# Patient Record
Sex: Male | Born: 1990 | Race: White | Hispanic: No | Marital: Single | Smoking: Current every day smoker
Health system: Southern US, Community
[De-identification: ages and names within clinical notes are randomized; demographics above are authoritative.]

## PROBLEM LIST (undated history)

## (undated) DIAGNOSIS — S62339A Displaced fracture of neck of unspecified metacarpal bone, initial encounter for closed fracture: Secondary | ICD-10-CM

## (undated) DIAGNOSIS — T4145XA Adverse effect of unspecified anesthetic, initial encounter: Secondary | ICD-10-CM

## (undated) DIAGNOSIS — E109 Type 1 diabetes mellitus without complications: Secondary | ICD-10-CM

## (undated) DIAGNOSIS — Z8781 Personal history of (healed) traumatic fracture: Secondary | ICD-10-CM

## (undated) DIAGNOSIS — T8859XA Other complications of anesthesia, initial encounter: Secondary | ICD-10-CM

---
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
## 2012-12-21 ENCOUNTER — Encounter (HOSPITAL_BASED_OUTPATIENT_CLINIC_OR_DEPARTMENT_OTHER): Payer: Self-pay | Admitting: Emergency Medicine

## 2012-12-21 ENCOUNTER — Emergency Department (HOSPITAL_BASED_OUTPATIENT_CLINIC_OR_DEPARTMENT_OTHER)
Admission: EM | Admit: 2012-12-21 | Discharge: 2012-12-21 | Disposition: A | Discharge status: Home / Self Care | Payer: No Typology Code available for payment source | Attending: Emergency Medicine | Admitting: Emergency Medicine

## 2012-12-21 ENCOUNTER — Emergency Department (HOSPITAL_BASED_OUTPATIENT_CLINIC_OR_DEPARTMENT_OTHER): Payer: No Typology Code available for payment source

## 2012-12-21 DIAGNOSIS — F17200 Nicotine dependence, unspecified, uncomplicated: Secondary | ICD-10-CM | POA: Insufficient documentation

## 2012-12-21 DIAGNOSIS — S62300A Unspecified fracture of second metacarpal bone, right hand, initial encounter for closed fracture: Secondary | ICD-10-CM

## 2012-12-21 DIAGNOSIS — S62309A Unspecified fracture of unspecified metacarpal bone, initial encounter for closed fracture: Secondary | ICD-10-CM | POA: Insufficient documentation

## 2012-12-21 DIAGNOSIS — Z8781 Personal history of (healed) traumatic fracture: Secondary | ICD-10-CM

## 2012-12-21 DIAGNOSIS — S0993XA Unspecified injury of face, initial encounter: Secondary | ICD-10-CM | POA: Insufficient documentation

## 2012-12-21 DIAGNOSIS — S02400A Malar fracture unspecified, initial encounter for closed fracture: Secondary | ICD-10-CM | POA: Insufficient documentation

## 2012-12-21 DIAGNOSIS — S0990XA Unspecified injury of head, initial encounter: Secondary | ICD-10-CM | POA: Insufficient documentation

## 2012-12-21 DIAGNOSIS — S02401A Maxillary fracture, unspecified, initial encounter for closed fracture: Secondary | ICD-10-CM | POA: Insufficient documentation

## 2012-12-21 HISTORY — DX: Personal history of (healed) traumatic fracture: Z87.81

## 2012-12-21 MED ORDER — HYDROCODONE-ACETAMINOPHEN 5-325 MG PO TABS: 1.0000 | ORAL_TABLET | ORAL | Status: DC | PRN
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
## 2012-12-21 NOTE — ED Notes (Signed)
ONLY CHARGE FOR ONE [employer] ORTHO SPLINT

## 2012-12-21 NOTE — ED Notes (Signed)
ONLY CHARGE FOR ONE
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
## 2012-12-31 DIAGNOSIS — S62339A Displaced fracture of neck of unspecified metacarpal bone, initial encounter for closed fracture: Secondary | ICD-10-CM

## 2012-12-31 HISTORY — DX: Displaced fracture of neck of unspecified metacarpal bone, initial encounter for closed fracture: S62.339A

## 2013-01-21 ENCOUNTER — Encounter (HOSPITAL_BASED_OUTPATIENT_CLINIC_OR_DEPARTMENT_OTHER): Payer: Self-pay | Admitting: *Deleted

## 2013-01-22 NOTE — H&P (Signed)
Henry Walter is an 22 y.o. male.   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 Fracture, metacarpal, neck 12/31/2012    right index  . H/O fracture of nose 12/21/2012    Past Surgical History  Procedure Laterality Date  . No past surgeries      History reviewed. No pertinent family history. Social History:  reports that he has been smoking Cigarettes.  He has a 3 pack-year smoking history. His smokeless tobacco use includes Chew. He reports that he drinks alcohol. He reports that he does not use illicit drugs.  Allergies:  Allergies  Allergen Reactions  . Hydrocodone Nausea And Vomiting  . Percocet [Oxycodone-Acetaminophen] Itching    No prescriptions prior to admission   ROS: NO RECENT ILLNESSES OR HOSPITALIZATIONS No results found for this or any previous visit (from the past 48 hour(s)). No results found.  General Appearance:  Alert, cooperative, no distress, appears stated age  Head:  Normocephalic, without obvious abnormality, atraumatic  Eyes:  Pupils equal, conjunctiva/corneas clear,         Throat: Lips, mucosa, and tongue normal; teeth and gums normal  Neck: No visible masses     Lungs:   respirations unlabored  Chest Wall:  No tenderness or deformity  Heart:  Regular rate and rhythm,  Abdomen:   Soft, non-tender,         Extremities: RIGHT HAND: +DEFORMITY TO DORSUM OF RIGHT HAND FINGER WARM WELL PERFUSED LOSS OF KNUCKLE CONTOUR DOES NOT HAVE FULL COMPOSITE FIST GOOD MOBILITY TO RING AND SMALL FINGER  Pulses: 2+ and symmetric  Skin: Skin color, texture, turgor normal, no rashes or lesions     Neurologic: Normal    Assessment/Plan 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  PT VOICED UNDERSTANDING OF PLAN CONSENT SIGNED DAY OF SURGERY PT SEEN AND EXAMINED PRIOR TO OPERATIVE PROCEDURE/DAY OF SURGERY SITE MARKED. QUESTIONS ANSWERED WILL GO HOME FOLLOWING SURGERY  Sharma Covert 01/23/2013 at 1433

## 2013-01-22 NOTE — Brief Op Note (Signed)
01/23/2013  3:27 PM  PATIENT:  Henry Walter  22 y.o. male  PRE-OPERATIVE DIAGNOSIS:  right index finger metacarpal neck fracture  POST-OPERATIVE DIAGNOSIS: SAME  PROCEDURE:  Procedure(s): RIGHT INDEX FINGER OPEN REDUCTION INTERNAL FIXATION (ORIF) METACARPAL POSSIBLE PINNING (Right)  SURGEON:  Surgeon(s) and Role:    * Sharma Covert, MD - Primary  PHYSICIAN ASSISTANT:   ASSISTANTS: none   ANESTHESIA:   general  EBL:   minimal  BLOOD ADMINISTERED:none  DRAINS: none   LOCAL MEDICATIONS USED:  MARCAINE     SPECIMEN:  No Specimen  DISPOSITION OF SPECIMEN:  N/A  COUNTS:  YES  TOURNIQUET:  * No tourniquets in log *  DICTATION: .Other Dictation: Dictation Number [id number]  PLAN OF CARE: Discharge to home after PACU  PATIENT DISPOSITION:  PACU - hemodynamically stable.   Delay start of Pharmacological VTE agent (>24hrs) due to surgical blood loss or risk of bleeding: not applicable

## 2013-01-23 ENCOUNTER — Encounter (HOSPITAL_BASED_OUTPATIENT_CLINIC_OR_DEPARTMENT_OTHER): Payer: Self-pay | Admitting: *Deleted

## 2013-01-23 ENCOUNTER — Encounter (HOSPITAL_BASED_OUTPATIENT_CLINIC_OR_DEPARTMENT_OTHER): Payer: No Typology Code available for payment source | Admitting: Anesthesiology

## 2013-01-23 ENCOUNTER — Encounter (HOSPITAL_BASED_OUTPATIENT_CLINIC_OR_DEPARTMENT_OTHER)
Admission: RE | Disposition: A | Discharge status: Home / Self Care | Payer: Self-pay | Source: Ambulatory Visit | Attending: Orthopedic Surgery

## 2013-01-23 ENCOUNTER — Ambulatory Visit (HOSPITAL_BASED_OUTPATIENT_CLINIC_OR_DEPARTMENT_OTHER): Payer: No Typology Code available for payment source | Admitting: Anesthesiology

## 2013-01-23 ENCOUNTER — Ambulatory Visit (HOSPITAL_BASED_OUTPATIENT_CLINIC_OR_DEPARTMENT_OTHER)
Admission: RE | Admit: 2013-01-23 | Discharge: 2013-01-23 | Disposition: A | Discharge status: Home / Self Care | Payer: No Typology Code available for payment source | Source: Ambulatory Visit | Attending: Orthopedic Surgery | Admitting: Orthopedic Surgery

## 2013-01-23 DIAGNOSIS — IMO0002 Reserved for concepts with insufficient information to code with codable children: Secondary | ICD-10-CM | POA: Insufficient documentation

## 2013-01-23 DIAGNOSIS — S62309A Unspecified fracture of unspecified metacarpal bone, initial encounter for closed fracture: Secondary | ICD-10-CM

## 2013-01-23 DIAGNOSIS — F17200 Nicotine dependence, unspecified, uncomplicated: Secondary | ICD-10-CM | POA: Insufficient documentation

## 2013-01-23 HISTORY — DX: Displaced fracture of neck of unspecified metacarpal bone, initial encounter for closed fracture: S62.339A

## 2013-01-23 HISTORY — PX: OPEN REDUCTION INTERNAL FIXATION (ORIF) METACARPAL: SHX6234

## 2013-01-23 HISTORY — DX: Personal history of (healed) traumatic fracture: Z87.81

## 2013-01-23 SURGERY — OPEN REDUCTION INTERNAL FIXATION (ORIF) METACARPAL
Anesthesia: General | Site: Finger | Laterality: Right | Wound class: Clean

## 2013-01-23 MED ORDER — MIDAZOLAM HCL 2 MG/ML PO SYRP: 12.0000 mg | ORAL_SOLUTION | Freq: Once | ORAL | Status: DC | PRN

## 2013-01-23 MED ORDER — LACTATED RINGERS IV SOLN
INTRAVENOUS | Status: DC
  Administered 2013-01-23 (×2): via INTRAVENOUS

## 2013-01-23 MED ORDER — MIDAZOLAM HCL 5 MG/5ML IJ SOLN
INTRAMUSCULAR | Status: DC | PRN
  Administered 2013-01-23: 2 mg via INTRAVENOUS

## 2013-01-23 MED ORDER — BUPIVACAINE HCL (PF) 0.25 % IJ SOLN
INTRAMUSCULAR | Status: AC
  Filled 2013-01-23: qty 30

## 2013-01-23 MED ORDER — BUPIVACAINE-EPINEPHRINE PF 0.5-1:200000 % IJ SOLN
INTRAMUSCULAR | Status: DC | PRN
  Administered 2013-01-23: 30 mL via PERINEURAL

## 2013-01-23 MED ORDER — MIDAZOLAM HCL 2 MG/2ML IJ SOLN
INTRAMUSCULAR | Status: AC
  Filled 2013-01-23: qty 2

## 2013-01-23 MED ORDER — DEXAMETHASONE SODIUM PHOSPHATE 10 MG/ML IJ SOLN
INTRAMUSCULAR | Status: DC | PRN
  Administered 2013-01-23: 10 mg via INTRAVENOUS

## 2013-01-23 MED ORDER — PROPOFOL 10 MG/ML IV BOLUS
INTRAVENOUS | Status: DC | PRN
  Administered 2013-01-23: 50 mg via INTRAVENOUS
  Administered 2013-01-23: 200 mg via INTRAVENOUS

## 2013-01-23 MED ORDER — OXYCODONE-ACETAMINOPHEN 10-325 MG PO TABS: 1.0000 | ORAL_TABLET | ORAL | Status: DC | PRN

## 2013-01-23 MED ORDER — CHLORHEXIDINE GLUCONATE 4 % EX LIQD: 60.0000 mL | Freq: Once | CUTANEOUS | Status: DC

## 2013-01-23 MED ORDER — CEFAZOLIN SODIUM-DEXTROSE 2-3 GM-% IV SOLR
INTRAVENOUS | Status: AC
  Filled 2013-01-23: qty 50

## 2013-01-23 MED ORDER — FENTANYL CITRATE 0.05 MG/ML IJ SOLN
INTRAMUSCULAR | Status: AC
  Filled 2013-01-23: qty 4

## 2013-01-23 MED ORDER — MIDAZOLAM HCL 2 MG/2ML IJ SOLN
1.0000 mg | INTRAMUSCULAR | Status: DC | PRN
  Administered 2013-01-23: 2 mg via INTRAVENOUS

## 2013-01-23 MED ORDER — ONDANSETRON HCL 4 MG PO TABS: 4.0000 mg | ORAL_TABLET | Freq: Three times a day (TID) | ORAL | Status: DC | PRN

## 2013-01-23 MED ORDER — BUPIVACAINE HCL (PF) 0.5 % IJ SOLN
INTRAMUSCULAR | Status: AC
  Filled 2013-01-23: qty 30

## 2013-01-23 MED ORDER — FENTANYL CITRATE 0.05 MG/ML IJ SOLN
INTRAMUSCULAR | Status: DC | PRN
  Administered 2013-01-23: 50 ug via INTRAVENOUS

## 2013-01-23 MED ORDER — FENTANYL CITRATE 0.05 MG/ML IJ SOLN
INTRAMUSCULAR | Status: AC
  Filled 2013-01-23: qty 2

## 2013-01-23 MED ORDER — CEFAZOLIN SODIUM-DEXTROSE 2-3 GM-% IV SOLR
2.0000 g | INTRAVENOUS | Status: AC
  Administered 2013-01-23: 2 g via INTRAVENOUS

## 2013-01-23 MED ORDER — FENTANYL CITRATE 0.05 MG/ML IJ SOLN
50.0000 ug | INTRAMUSCULAR | Status: DC | PRN
  Administered 2013-01-23: 100 ug via INTRAVENOUS

## 2013-01-23 MED ORDER — DOCUSATE SODIUM 100 MG PO CAPS: 100.0000 mg | ORAL_CAPSULE | Freq: Two times a day (BID) | ORAL | Status: DC

## 2013-01-23 MED ORDER — ONDANSETRON HCL 4 MG/2ML IJ SOLN
INTRAMUSCULAR | Status: DC | PRN
  Administered 2013-01-23: 4 mg via INTRAVENOUS

## 2013-01-23 MED ORDER — PROPOFOL 10 MG/ML IV BOLUS
INTRAVENOUS | Status: AC
  Filled 2013-01-23: qty 20

## 2013-01-23 SURGICAL SUPPLY — 71 items
BANDAGE ELASTIC 3 VELCRO ST LF (GAUZE/BANDAGES/DRESSINGS) IMPLANT
BANDAGE ELASTIC 4 VELCRO ST LF (GAUZE/BANDAGES/DRESSINGS) ×2 IMPLANT
BANDAGE GAUZE ELAST BULKY 4 IN (GAUZE/BANDAGES/DRESSINGS) ×2 IMPLANT
BENZOIN TINCTURE PRP APPL 2/3 (GAUZE/BANDAGES/DRESSINGS) IMPLANT
BIT DRILL 1.1 MINI QC NONSTRL (BIT) ×2 IMPLANT
BLADE SURG 15 STRL LF DISP TIS (BLADE) ×2 IMPLANT
BLADE SURG 15 STRL SS (BLADE) ×2
BNDG ELASTIC 2 VLCR STRL LF (GAUZE/BANDAGES/DRESSINGS) IMPLANT
BNDG ESMARK 4X9 LF (GAUZE/BANDAGES/DRESSINGS) ×2 IMPLANT
CANISTER SUCT 1200ML W/VALVE (MISCELLANEOUS) IMPLANT
CANISTER SUCTION 1200CC (MISCELLANEOUS) ×2 IMPLANT
CORDS BIPOLAR (ELECTRODE) ×4 IMPLANT
COVER TABLE BACK 60X90 (DRAPES) ×2 IMPLANT
CUFF TOURNIQUET SINGLE 18IN (TOURNIQUET CUFF) ×2 IMPLANT
DECANTER SPIKE VIAL GLASS SM (MISCELLANEOUS) IMPLANT
DRAPE EXTREMITY T 121X128X90 (DRAPE) ×2 IMPLANT
DRAPE OEC MINIVIEW 54X84 (DRAPES) ×2 IMPLANT
DRAPE SURG 17X23 STRL (DRAPES) ×2 IMPLANT
DRSG EMULSION OIL 3X3 NADH (GAUZE/BANDAGES/DRESSINGS) ×2 IMPLANT
GAUZE SPONGE 4X4 16PLY XRAY LF (GAUZE/BANDAGES/DRESSINGS) IMPLANT
GAUZE XEROFORM 1X8 LF (GAUZE/BANDAGES/DRESSINGS) IMPLANT
GLOVE BIO SURGEON STRL SZ8 (GLOVE) ×4 IMPLANT
GLOVE BIOGEL M 7.0 STRL (GLOVE) ×2 IMPLANT
GLOVE BIOGEL PI IND STRL 7.0 (GLOVE) ×1 IMPLANT
GLOVE BIOGEL PI IND STRL 8.5 (GLOVE) ×1 IMPLANT
GLOVE BIOGEL PI INDICATOR 7.0 (GLOVE) ×1
GLOVE BIOGEL PI INDICATOR 8.5 (GLOVE) ×1
GLOVE ECLIPSE 6.5 STRL STRAW (GLOVE) ×2 IMPLANT
GLOVE EXAM NITRILE EXT CUFF MD (GLOVE) ×2 IMPLANT
GLOVE SURG SS PI 7.5 STRL IVOR (GLOVE) ×2 IMPLANT
GOWN PREVENTION PLUS XLARGE (GOWN DISPOSABLE) ×4 IMPLANT
GOWN STRL REIN 2XL XLG LVL4 (GOWN DISPOSABLE) ×2 IMPLANT
K-WIRE .045X6 DBL TRO NS (WIRE) ×4
K-WIRE DBL TRONS .035X6 (WIRE) ×4
KWIRE .045X6 DBL TRO NS (WIRE) ×2 IMPLANT
KWIRE DBL TRONS .035X6 (WIRE) ×2 IMPLANT
LOCK SCREW 1.5X15MM (Screw) ×8 IMPLANT
NEEDLE HYPO 25X1 1.5 SAFETY (NEEDLE) IMPLANT
NS IRRIG 1000ML POUR BTL (IV SOLUTION) ×2 IMPLANT
PACK BASIN DAY SURGERY FS (CUSTOM PROCEDURE TRAY) ×2 IMPLANT
PAD CAST 3X4 CTTN HI CHSV (CAST SUPPLIES) ×1 IMPLANT
PAD CAST 4YDX4 CTTN HI CHSV (CAST SUPPLIES) ×1 IMPLANT
PADDING CAST ABS 3INX4YD NS (CAST SUPPLIES) ×1
PADDING CAST ABS COTTON 3X4 (CAST SUPPLIES) ×1 IMPLANT
PADDING CAST COTTON 3X4 STRL (CAST SUPPLIES) ×1
PADDING CAST COTTON 4X4 STRL (CAST SUPPLIES) ×1
PADDING UNDERCAST 2  STERILE (CAST SUPPLIES) IMPLANT
PLATE T SMALL 1.5MM (Plate) ×2 IMPLANT
SCREW 1.5X15MM (Screw) ×2 IMPLANT
SCREW LOCK 1.5X15MM (Screw) ×4 IMPLANT
SCREW LOCKING 1.5X16 (Screw) ×2 IMPLANT
SCREW NONIOC 1.5 14M (Screw) ×2 IMPLANT
SHEET MEDIUM DRAPE 40X70 STRL (DRAPES) IMPLANT
SPLINT FIBERGLASS 3X35 (CAST SUPPLIES) IMPLANT
SPLINT FIBERGLASS 4X30 (CAST SUPPLIES) ×2 IMPLANT
STOCKINETTE 4X48 STRL (DRAPES) ×2 IMPLANT
STRIP CLOSURE SKIN 1/2X4 (GAUZE/BANDAGES/DRESSINGS) IMPLANT
SUCTION FRAZIER TIP 10 FR DISP (SUCTIONS) ×2 IMPLANT
SUT MNCRL AB 3-0 PS2 18 (SUTURE) ×2 IMPLANT
SUT MON AB 4-0 PC3 18 (SUTURE) IMPLANT
SUT PROLENE 4 0 PS 2 18 (SUTURE) ×2 IMPLANT
SUT VIC AB 2-0 SH 27 (SUTURE) ×1
SUT VIC AB 2-0 SH 27XBRD (SUTURE) ×1 IMPLANT
SUT VIC AB 3-0 FS2 27 (SUTURE) ×2 IMPLANT
SUT VICRYL 4-0 PS2 18IN ABS (SUTURE) ×2 IMPLANT
SYR BULB 3OZ (MISCELLANEOUS) ×2 IMPLANT
SYR CONTROL 10ML LL (SYRINGE) IMPLANT
TOWEL OR 17X24 6PK STRL BLUE (TOWEL DISPOSABLE) ×4 IMPLANT
TRAY DSU PREP LF (CUSTOM PROCEDURE TRAY) ×2 IMPLANT
TUBE CONNECTING 20X1/4 (TUBING) ×2 IMPLANT
UNDERPAD 30X30 INCONTINENT (UNDERPADS AND DIAPERS) ×2 IMPLANT

## 2013-01-23 NOTE — Anesthesia Postprocedure Evaluation (Signed)
Anesthesia Post Note  Patient: Henry Walter  Procedure(s) Performed: Procedure(s) (LRB): RIGHT INDEX FINGER OPEN REDUCTION INTERNAL FIXATION (ORIF) METACARPAL  (Right)  Anesthesia type: general  Patient location: PACU  Post pain: Pain level controlled  Post assessment: Patient's Cardiovascular Status Stable  Last Vitals:  Filed Vitals:   01/23/13 1800  BP: 139/72  Pulse: 73  Temp: 37.1 C  Resp: 14    Post vital signs: Reviewed and stable  Level of consciousness: sedated  Complications: No apparent anesthesia complications

## 2013-01-23 NOTE — Transfer of Care (Signed)
Immediate Anesthesia Transfer of Care Note  Patient: Henry Walter  Procedure(s) Performed: Procedure(s): RIGHT INDEX FINGER OPEN REDUCTION INTERNAL FIXATION (ORIF) METACARPAL  (Right)  Patient Location: PACU  Anesthesia Type:General and Regional  Level of Consciousness: awake  Airway & Oxygen Therapy: Patient Spontanous Breathing and Patient connected to face mask oxygen  Post-op Assessment: Report given to PACU RN and Post -op Vital signs reviewed and stable  Post vital signs: Reviewed and stable  Complications: No apparent anesthesia complications

## 2013-01-23 NOTE — Anesthesia Preprocedure Evaluation (Signed)
Anesthesia Evaluation  Patient identified by MRN, date of birth, ID band Patient awake    Reviewed: Allergy & Precautions, H&P , NPO status , Patient's Chart, lab work & pertinent test results  Airway Mallampati: I TM Distance: >3 FB Neck ROM: Full    Dental   Pulmonary Current Smoker,          Cardiovascular     Neuro/Psych    GI/Hepatic   Endo/Other    Renal/GU      Musculoskeletal   Abdominal   Peds  Hematology   Anesthesia Other Findings   Reproductive/Obstetrics                           Anesthesia Physical Anesthesia Plan  ASA: I  Anesthesia Plan: General   Post-op Pain Management:    Induction: Intravenous  Airway Management Planned: LMA  Additional Equipment:   Intra-op Plan:   Post-operative Plan: Extubation in OR  Informed Consent: I have reviewed the patients History and Physical, chart, labs and discussed the procedure including the risks, benefits and alternatives for the proposed anesthesia with the patient or authorized representative who has indicated his/her understanding and acceptance.     Plan Discussed with: CRNA and Surgeon  Anesthesia Plan Comments:         Anesthesia Quick Evaluation  

## 2013-01-23 NOTE — Progress Notes (Signed)
Assisted Dr. Ossey with right, ultrasound guided, supraclavicular block. Side rails up, monitors on throughout procedure. See vital signs in flow sheet. Tolerated Procedure well. 

## 2013-01-23 NOTE — Brief Op Note (Signed)
01/23/2013  6:10 PM  PATIENT:  Henry Walter  22 y.o. male  PRE-OPERATIVE DIAGNOSIS:  right index finger metacarpal neck fracture  POST-OPERATIVE DIAGNOSIS: SAME  PROCEDURE:  Procedure(s): RIGHT INDEX FINGER OPEN REDUCTION INTERNAL FIXATION (ORIF) METACARPAL  (Right)  SURGEON:  Surgeon(s) and Role:    * Sharma Covert, MD - Primary  PHYSICIAN ASSISTANT:   ASSISTANTS: none   ANESTHESIA:   general  EBL:  Total I/O In: 1600 [I.V.:1600] Out: - minimal  BLOOD ADMINISTERED:none  DRAINS: none   LOCAL MEDICATIONS USED:  MARCAINE     SPECIMEN:  No Specimen  DISPOSITION OF SPECIMEN:  N/A  COUNTS:  YES  TOURNIQUET:    DICTATION: .Other Dictation: Dictation Number [id number]  PLAN OF CARE: Discharge to home after PACU  PATIENT DISPOSITION:  PACU - hemodynamically stable.   Delay start of Pharmacological VTE agent (>24hrs) due to surgical blood loss or risk of bleeding: not applicable

## 2013-01-23 NOTE — Anesthesia Procedure Notes (Addendum)
Anesthesia Regional Block:  Supraclavicular block  Pre-Anesthetic Checklist: ,, timeout performed, Correct Patient, Correct Site, Correct Laterality, Correct Procedure, Correct Position, site marked, Risks and benefits discussed,  Surgical consent,  Pre-op evaluation,  At surgeon's request and post-op pain management  Laterality: Right  Prep: chloraprep       Needles:   Needle Type: Echogenic Stimulator Needle     Needle Length: 5cm 5 cm Needle Gauge: 21 and 21 G    Additional Needles:  Procedures: ultrasound guided (picture in chart) and nerve stimulator Supraclavicular block  Nerve Stimulator or Paresthesia:  Response: 0.4 mA,   Additional Responses:   Narrative:  Start time: 01/23/2013 1:46 PM End time: 01/23/2013 1:56 PM Injection made incrementally with aspirations every 5 mL.  Performed by: Personally  Anesthesiologist: Arta Bruce MD  Additional Notes: Monitors applied. Patient sedated. Sterile prep and drape,hand hygiene and sterile gloves were used. Relevant anatomy identified.Needle position confirmed.Local anesthetic injected incrementally after negative aspiration. Local anesthetic spread visualized around nerve(s). Vascular puncture avoided. No complications. Image printed for medical record.The patient tolerated the procedure well.       Supraclavicular block Procedure Name: LMA Insertion Date/Time: 01/23/2013 2:56 PM Performed by: Burna Cash Pre-anesthesia Checklist: Patient identified, Emergency Drugs available, Suction available and Patient being monitored Patient Re-evaluated:Patient Re-evaluated prior to inductionOxygen Delivery Method: Circle System Utilized Preoxygenation: Pre-oxygenation with 100% oxygen Intubation Type: IV induction Ventilation: Mask ventilation without difficulty LMA: LMA inserted LMA Size: 5.0 Number of attempts: 1 Airway Equipment and Method: bite block Placement Confirmation: positive ETCO2 Tube secured with:  Tape Dental Injury: Teeth and Oropharynx as per pre-operative assessment

## 2013-01-24 ENCOUNTER — Encounter (HOSPITAL_BASED_OUTPATIENT_CLINIC_OR_DEPARTMENT_OTHER): Payer: Self-pay | Admitting: Orthopedic Surgery

## 2013-01-31 NOTE — Op Note (Signed)
NAME:  Henry Walter, Henry Walter               ACCOUNT NO.:  [id number]  MEDICAL RECORD NO.:  [id number]  LOCATION:                               FACILITY:  [facility]  PHYSICIAN:  Mousa Prout W Isiac Breighner IV, MD  DATE OF BIRTH:  09/23/1990  DATE OF PROCEDURE:  01/23/2013 DATE OF DISCHARGE:  01/23/2013                              OPERATIVE REPORT   PREOPERATIVE DIAGNOSIS:  Right index finger metacarpal neck malunion.  POSTOPERATIVE DIAGNOSIS:  Right index finger metacarpal neck malunion.  ATTENDING PHYSICIAN:  Colbin Jovel W Biviana Saddler IV, MD, who was scrubbed and present for the entire procedure.  ASSISTANT SURGEON:  None.  ANESTHESIA:  General via LMA.  SURGICAL PROCEDURE: 1. Open treatment of right index finger metacarpal nascent malunion     with internal fixation. 2. Radiographs 3 views, right hand.  SURGICAL INDICATIONS:  Mr. [name] is a right-hand-dominant gentleman, who sustained a closed metacarpal neck fracture greater than 1 month ago.  The patient was followed in the office and was noted to have the nascent  malunion.  The patient is recommended to undergo the above procedure.  Risks, benefits, and alternatives were discussed in detail with the patient.  Signed informed consent was obtained.  Risks include, but not limited to bleeding, infection, damage to nearby nerves, arteries, or tendons, loss of motion of the wrist and digits, incomplete relief of symptoms, and need for further surgical intervention.  DESCRIPTION OF PROCEDURE:  The patient was properly identified in the preoperative holding area and marked with a permanent marker made on the right index finger to indicate correct operative site.  The patient was then brought back to operative room, placed supine on the anesthesia room table.  General anesthesia was administered.  The patient tolerated this well.  A well-padded tourniquet was then placed on the right brachium and sealed with 1000 drape.  Right upper extremity was then prepped  and draped in normal sterile fashion.  Time-out was called. Correct site was identified, and procedure was then begun.  Attention was then turned to the right hand where a longitudinal incision made directly over the index finger metacarpal.  Dissection was then carried down through the skin and subcutaneous tissue.  The extensor tendons were then carefully retracted and the fascial layer over the bone was then carefully exposed.  The patient did have a malunion of the index finger metacarpal neck region and time was spent taking down the malunion. The patient did have abundant fracture callus and careful dissection was made to takedown the fracture callus.  This was released both dorsally and volarly and along the radial ulnar margins in order to free the distal fragment.  Once the distal fragment was then freed up, internal fixation was then carried out with a small T plate.  The 1.5 mm T-plate from the DePuy hand ALPS.  A combination of the locking and nonlocking screws were then used proximally and distally in order to maintain the reduction.  Appropriate drilling and depth gauge measurement was then carried out.  The wound was then thoroughly irrigated.  After internal fixation of the nascent malunion, the wound was then copiously irrigated.  The fascial layer was then closed   over with Vicryl suture.  The subcutaneous tissue was closed with 4-0 Vicryl and the skin closed with skin sutures and 4-0 Prolene sutures.  A 10 mL of 0.25% Marcaine infiltrated locally.  Adaptic dressing and sterile compressive bandage were then applied.  The patient was then placed in a well-padded radial gutter splint, extubated, and taken to recovery room in good condition.  Radiographs 3 views of the hand did show the internal fixation in place. There was good position in both planes.  POSTPROCEDURE PLAN:  The patient discharged home and seen back in the office in approximately 2 weeks for wound check,  suture removal, x-rays, out of the splint and then we will likely immobilize him for a total of 4 weeks, and then begin a therapy regimen until 4-week mark. Radiographs at each visit.     Domique Clapper W Felecia Stanfill IV, MD   ______________________________ Tramell Piechota W Amador Braddy IV, MD    FWO/MEDQ  D:  01/30/2013  T:  01/30/2013  Job:  [id number] 

## 2013-01-31 NOTE — Op Note (Deleted)
Henry Walter, CIMO NO.:  [id number]  MEDICAL RECORD NO.:  [id number]  LOCATION:                               FACILITY:  [facility]  PHYSICIAN:  Madelynn Done, MD  DATE OF BIRTH:  February 26, 1991  DATE OF PROCEDURE:  01/23/2013 DATE OF DISCHARGE:  01/23/2013                              OPERATIVE REPORT   PREOPERATIVE DIAGNOSIS:  Right index finger metacarpal neck malunion.  POSTOPERATIVE DIAGNOSIS:  Right index finger metacarpal neck malunion.  ATTENDING PHYSICIAN:  Madelynn Done, MD, who was scrubbed and present for the entire procedure.  ASSISTANT SURGEON:  None.  ANESTHESIA:  General via LMA.  SURGICAL PROCEDURE: 1. Open treatment of right index finger metacarpal nascent malunion     with internal fixation. 2. Radiographs 3 views, right hand.  SURGICAL INDICATIONS:  Mr. Passey is a right-hand-dominant gentleman, who sustained a closed metacarpal neck fracture greater than 1 month ago.  The patient was followed in the office and was noted to have the nascent  malunion.  The patient is recommended to undergo the above procedure.  Risks, benefits, and alternatives were discussed in detail with the patient.  Signed informed consent was obtained.  Risks include, but not limited to bleeding, infection, damage to nearby nerves, arteries, or tendons, loss of motion of the wrist and digits, incomplete relief of symptoms, and need for further surgical intervention.  DESCRIPTION OF PROCEDURE:  The patient was properly identified in the preoperative holding area and marked with a permanent marker made on the right index finger to indicate correct operative site.  The patient was then brought back to operative room, placed supine on the anesthesia room table.  General anesthesia was administered.  The patient tolerated this well.  A well-padded tourniquet was then placed on the right brachium and sealed with 1000 drape.  Right upper extremity was then prepped  and draped in normal sterile fashion.  Time-out was called. Correct site was identified, and procedure was then begun.  Attention was then turned to the right hand where a longitudinal incision made directly over the index finger metacarpal.  Dissection was then carried down through the skin and subcutaneous tissue.  The extensor tendons were then carefully retracted and the fascial layer over the bone was then carefully exposed.  The patient did have a malunion of the index finger metacarpal neck region and time was spent taking down the malunion. The patient did have abundant fracture callus and careful dissection was made to takedown the fracture callus.  This was released both dorsally and volarly and along the radial ulnar margins in order to free the distal fragment.  Once the distal fragment was then freed up, internal fixation was then carried out with a small T plate.  The 1.5 mm T-plate from the DePuy hand ALPS.  A combination of the locking and nonlocking screws were then used proximally and distally in order to maintain the reduction.  Appropriate drilling and depth gauge measurement was then carried out.  The wound was then thoroughly irrigated.  After internal fixation of the nascent malunion, the wound was then copiously irrigated.  The fascial layer was then closed  over with Vicryl suture.  The subcutaneous tissue was closed with 4-0 Vicryl and the skin closed with skin sutures and 4-0 Prolene sutures.  A 10 mL of 0.25% Marcaine infiltrated locally.  Adaptic dressing and sterile compressive bandage were then applied.  The patient was then placed in a well-padded radial gutter splint, extubated, and taken to recovery room in good condition.  Radiographs 3 views of the hand did show the internal fixation in place. There was good position in both planes.  POSTPROCEDURE PLAN:  The patient discharged home and seen back in the office in approximately 2 weeks for wound check,  suture removal, x-rays, out of the splint and then we will likely immobilize him for a total of 4 weeks, and then begin a therapy regimen until 4-week mark. Radiographs at each visit.     Madelynn Done, MD   ______________________________ Madelynn Done, MD    FWO/MEDQ  D:  01/30/2013  T:  01/30/2013  Job:  [id number]

## 2014-11-23 ENCOUNTER — Inpatient Hospital Stay (HOSPITAL_COMMUNITY): Payer: BLUE CROSS/BLUE SHIELD | Admitting: Certified Registered"

## 2014-11-23 ENCOUNTER — Observation Stay (HOSPITAL_COMMUNITY)
Admission: AD | Admit: 2014-11-23 | Discharge: 2014-11-24 | Disposition: A | Discharge status: Home / Self Care | Payer: BLUE CROSS/BLUE SHIELD | Source: Ambulatory Visit | Attending: Orthopedic Surgery | Admitting: Orthopedic Surgery

## 2014-11-23 ENCOUNTER — Encounter (HOSPITAL_COMMUNITY)
Admission: AD | Disposition: A | Discharge status: Home / Self Care | Payer: Self-pay | Source: Ambulatory Visit | Attending: Orthopedic Surgery

## 2014-11-23 ENCOUNTER — Encounter (HOSPITAL_COMMUNITY): Payer: Self-pay | Admitting: *Deleted

## 2014-11-23 DIAGNOSIS — F17210 Nicotine dependence, cigarettes, uncomplicated: Secondary | ICD-10-CM | POA: Insufficient documentation

## 2014-11-23 DIAGNOSIS — S52502A Unspecified fracture of the lower end of left radius, initial encounter for closed fracture: Secondary | ICD-10-CM | POA: Diagnosis present

## 2014-11-23 DIAGNOSIS — X58XXXA Exposure to other specified factors, initial encounter: Secondary | ICD-10-CM | POA: Diagnosis not present

## 2014-11-23 DIAGNOSIS — S52515A Nondisplaced fracture of left radial styloid process, initial encounter for closed fracture: Principal | ICD-10-CM | POA: Insufficient documentation

## 2014-11-23 DIAGNOSIS — G5602 Carpal tunnel syndrome, left upper limb: Secondary | ICD-10-CM | POA: Diagnosis not present

## 2014-11-23 HISTORY — PX: HAND SURGERY: SHX662

## 2014-11-23 HISTORY — DX: Other complications of anesthesia, initial encounter: T88.59XA

## 2014-11-23 HISTORY — DX: Adverse effect of unspecified anesthetic, initial encounter: T41.45XA

## 2014-11-23 HISTORY — PX: OPEN REDUCTION INTERNAL FIXATION (ORIF) DISTAL RADIAL FRACTURE: SHX5989

## 2014-11-23 SURGERY — OPEN REDUCTION INTERNAL FIXATION (ORIF) DISTAL RADIUS FRACTURE
Anesthesia: General | Site: Wrist | Laterality: Left

## 2014-11-23 MED ORDER — FENTANYL CITRATE (PF) 250 MCG/5ML IJ SOLN
INTRAMUSCULAR | Status: AC
  Filled 2014-11-23: qty 5

## 2014-11-23 MED ORDER — 0.9 % SODIUM CHLORIDE (POUR BTL) OPTIME
TOPICAL | Status: DC | PRN
Start: 2014-11-23 — End: 2014-11-24
  Administered 2014-11-23: 1000 mL

## 2014-11-23 MED ORDER — PROPOFOL 10 MG/ML IV BOLUS
INTRAVENOUS | Status: AC
  Filled 2014-11-23: qty 20

## 2014-11-23 MED ORDER — PROPOFOL 10 MG/ML IV BOLUS
INTRAVENOUS | Status: DC | PRN
  Administered 2014-11-23: 40 mg via INTRAVENOUS
  Administered 2014-11-23: 200 mg via INTRAVENOUS
  Administered 2014-11-24: 50 mg via INTRAVENOUS

## 2014-11-23 MED ORDER — ACETAMINOPHEN 500 MG PO TABS: 1000.0000 mg | ORAL_TABLET | Freq: Once | ORAL | Status: DC

## 2014-11-23 MED ORDER — MIDAZOLAM HCL 2 MG/2ML IJ SOLN
INTRAMUSCULAR | Status: AC
  Filled 2014-11-23: qty 4

## 2014-11-23 MED ORDER — LIDOCAINE HCL (CARDIAC) 20 MG/ML IV SOLN
INTRAVENOUS | Status: DC | PRN
  Administered 2014-11-23: 80 mg via INTRAVENOUS

## 2014-11-23 MED ORDER — POTASSIUM CHLORIDE IN NACL 20-0.45 MEQ/L-% IV SOLN
INTRAVENOUS | Status: DC
  Filled 2014-11-23: qty 1000

## 2014-11-23 MED ORDER — ONDANSETRON HCL 4 MG/2ML IJ SOLN
INTRAMUSCULAR | Status: AC
  Filled 2014-11-23: qty 2

## 2014-11-23 MED ORDER — LACTATED RINGERS IV SOLN
INTRAVENOUS | Status: DC
  Administered 2014-11-23 (×2): via INTRAVENOUS

## 2014-11-23 MED ORDER — FENTANYL CITRATE (PF) 250 MCG/5ML IJ SOLN
INTRAMUSCULAR | Status: DC | PRN
  Administered 2014-11-23 (×3): 50 ug via INTRAVENOUS
  Administered 2014-11-23 (×2): 100 ug via INTRAVENOUS
  Administered 2014-11-24: 50 ug via INTRAVENOUS

## 2014-11-23 MED ORDER — CEFAZOLIN SODIUM-DEXTROSE 2-3 GM-% IV SOLR
2.0000 g | INTRAVENOUS | Status: AC
  Administered 2014-11-23: 2 g via INTRAVENOUS
  Filled 2014-11-23: qty 50

## 2014-11-23 MED ORDER — SUCCINYLCHOLINE CHLORIDE 20 MG/ML IJ SOLN
INTRAMUSCULAR | Status: DC | PRN
  Administered 2014-11-23: 100 mg via INTRAVENOUS

## 2014-11-23 MED ORDER — ONDANSETRON HCL 4 MG/2ML IJ SOLN
INTRAMUSCULAR | Status: DC | PRN
  Administered 2014-11-23: 4 mg via INTRAVENOUS

## 2014-11-23 MED ORDER — BUPIVACAINE-EPINEPHRINE (PF) 0.5% -1:200000 IJ SOLN
INTRAMUSCULAR | Status: DC | PRN
  Administered 2014-11-23: 25 mL

## 2014-11-23 MED ORDER — CHLORHEXIDINE GLUCONATE 4 % EX LIQD: 60.0000 mL | Freq: Once | CUTANEOUS | Status: DC

## 2014-11-23 MED ORDER — ROCURONIUM BROMIDE 50 MG/5ML IV SOLN
INTRAVENOUS | Status: AC
  Filled 2014-11-23: qty 1

## 2014-11-23 SURGICAL SUPPLY — 70 items
BANDAGE ELASTIC 3 VELCRO ST LF (GAUZE/BANDAGES/DRESSINGS) ×3 IMPLANT
BANDAGE ELASTIC 4 VELCRO ST LF (GAUZE/BANDAGES/DRESSINGS) ×3 IMPLANT
BENZOIN TINCTURE PRP APPL 2/3 (GAUZE/BANDAGES/DRESSINGS) IMPLANT
BIT DRILL 2.2 SS TIBIAL (BIT) ×3 IMPLANT
BLADE SURG ROTATE 9660 (MISCELLANEOUS) IMPLANT
BNDG COHESIVE 4X5 TAN STRL (GAUZE/BANDAGES/DRESSINGS) IMPLANT
BNDG ESMARK 4X9 LF (GAUZE/BANDAGES/DRESSINGS) ×3 IMPLANT
BNDG GAUZE ELAST 4 BULKY (GAUZE/BANDAGES/DRESSINGS) ×3 IMPLANT
CLOSURE WOUND 1/2 X4 (GAUZE/BANDAGES/DRESSINGS)
CORDS BIPOLAR (ELECTRODE) ×3 IMPLANT
COVER SURGICAL LIGHT HANDLE (MISCELLANEOUS) ×3 IMPLANT
CUFF TOURNIQUET SINGLE 18IN (TOURNIQUET CUFF) ×3 IMPLANT
CUFF TOURNIQUET SINGLE 24IN (TOURNIQUET CUFF) IMPLANT
DECANTER SPIKE VIAL GLASS SM (MISCELLANEOUS) IMPLANT
DRAPE OEC MINIVIEW 54X84 (DRAPES) ×3 IMPLANT
DRAPE U-SHAPE 47X51 STRL (DRAPES) IMPLANT
DRSG ADAPTIC 3X8 NADH LF (GAUZE/BANDAGES/DRESSINGS) ×3 IMPLANT
ELECT REM PT RETURN 9FT ADLT (ELECTROSURGICAL) ×3
ELECTRODE REM PT RTRN 9FT ADLT (ELECTROSURGICAL) ×1 IMPLANT
GAUZE SPONGE 4X4 12PLY STRL (GAUZE/BANDAGES/DRESSINGS) ×3 IMPLANT
GAUZE XEROFORM 1X8 LF (GAUZE/BANDAGES/DRESSINGS) ×3 IMPLANT
GLOVE BIO SURGEON STRL SZ7 (GLOVE) ×3 IMPLANT
GLOVE BIO SURGEON STRL SZ7.5 (GLOVE) IMPLANT
GLOVE BIOGEL PI IND STRL 7.0 (GLOVE) ×1 IMPLANT
GLOVE BIOGEL PI IND STRL 8 (GLOVE) IMPLANT
GLOVE BIOGEL PI INDICATOR 7.0 (GLOVE) ×2
GLOVE BIOGEL PI INDICATOR 8 (GLOVE)
GOWN STRL REUS W/ TWL LRG LVL3 (GOWN DISPOSABLE) ×1 IMPLANT
GOWN STRL REUS W/ TWL XL LVL3 (GOWN DISPOSABLE) IMPLANT
GOWN STRL REUS W/TWL LRG LVL3 (GOWN DISPOSABLE) ×2
GOWN STRL REUS W/TWL XL LVL3 (GOWN DISPOSABLE)
K-WIRE 1.6 (WIRE) ×8
K-WIRE FX5X1.6XNS BN SS (WIRE) ×4
KIT BASIN OR (CUSTOM PROCEDURE TRAY) ×3 IMPLANT
KIT ROOM TURNOVER OR (KITS) ×3 IMPLANT
KWIRE FX5X1.6XNS BN SS (WIRE) ×4 IMPLANT
MANIFOLD NEPTUNE II (INSTRUMENTS) IMPLANT
NEEDLE HYPO 25GX1X1/2 BEV (NEEDLE) IMPLANT
NS IRRIG 1000ML POUR BTL (IV SOLUTION) ×3 IMPLANT
PACK ORTHO EXTREMITY (CUSTOM PROCEDURE TRAY) ×3 IMPLANT
PAD ARMBOARD 7.5X6 YLW CONV (MISCELLANEOUS) ×6 IMPLANT
PAD CAST 4YDX4 CTTN HI CHSV (CAST SUPPLIES) ×1 IMPLANT
PADDING CAST ABS 4INX4YD NS (CAST SUPPLIES) ×2
PADDING CAST ABS COTTON 4X4 ST (CAST SUPPLIES) ×1 IMPLANT
PADDING CAST COTTON 4X4 STRL (CAST SUPPLIES) ×2
PEG LOCKING SMOOTH 2.2X20 (Screw) ×3 IMPLANT
PEG LOCKING SMOOTH 2.2X24 (Peg) ×6 IMPLANT
PEG LOCKING SMOOTH 2.2X26 (Peg) ×12 IMPLANT
PLATE STANDARD DVR LEFT (Plate) ×3 IMPLANT
PLATE STD DVR LT 24X51 (Plate) ×1 IMPLANT
SCREW  LP NL 2.7X15MM (Screw) ×2 IMPLANT
SCREW  LP NL 2.7X16MM (Screw) ×2 IMPLANT
SCREW LP NL 2.7X15MM (Screw) ×1 IMPLANT
SCREW LP NL 2.7X16MM (Screw) ×1 IMPLANT
SCREW LP NL 2.7X22MM (Screw) ×3 IMPLANT
SCREW NONLOCK 2.7X18MM (Screw) ×6 IMPLANT
SPLINT FIBERGLASS 3X12 (CAST SUPPLIES) ×3 IMPLANT
SPONGE GAUZE 4X4 12PLY STER LF (GAUZE/BANDAGES/DRESSINGS) ×3 IMPLANT
SPONGE LAP 4X18 X RAY DECT (DISPOSABLE) IMPLANT
STRIP CLOSURE SKIN 1/2X4 (GAUZE/BANDAGES/DRESSINGS) IMPLANT
SUT ETHILON 3 0 PS 1 (SUTURE) ×6 IMPLANT
SUT MNCRL AB 4-0 PS2 18 (SUTURE) IMPLANT
SYR CONTROL 10ML LL (SYRINGE) IMPLANT
TOWEL OR 17X24 6PK STRL BLUE (TOWEL DISPOSABLE) ×3 IMPLANT
TOWEL OR 17X26 10 PK STRL BLUE (TOWEL DISPOSABLE) ×3 IMPLANT
TOWEL OR NON WOVEN STRL DISP B (DISPOSABLE) ×3 IMPLANT
TUBE CONNECTING 12'X1/4 (SUCTIONS) ×1
TUBE CONNECTING 12X1/4 (SUCTIONS) ×2 IMPLANT
WATER STERILE IRR 1000ML POUR (IV SOLUTION) ×3 IMPLANT
YANKAUER SUCT BULB TIP NO VENT (SUCTIONS) IMPLANT

## 2014-11-23 NOTE — Anesthesia Procedure Notes (Addendum)
Anesthesia Regional Block:  Supraclavicular block  Pre-Anesthetic Checklist: ,, timeout performed, Correct Patient, Correct Site, Correct Laterality, Correct Procedure, Correct Position, site marked, Risks and benefits discussed,  Surgical consent,  Pre-op evaluation,  At surgeon's request and post-op pain management   Prep: chloraprep       Needles:  Injection technique: Single-shot  Needle Type: Stimiplex     Needle Length: 5cm 5 cm Needle Gauge: 22 and 22 G    Additional Needles:  Procedures: ultrasound guided (picture in chart) Supraclavicular block  Nerve Stimulator or Paresthesia:  Response: biceps flexion,   Additional Responses:   Narrative:  Start time: 11/23/2014 9:45 PM End time: 11/23/2014 9:50 PM Injection made incrementally with aspirations every 5 mL.  Performed by: Personally  Anesthesiologist: Shona Simpson D  Additional Notes: Functioning IV was confirmed and monitors were applied.  A 50mm 22ga Stimuplex needle was used. Sterile prep and drape,hand hygiene and sterile gloves were used.  Negative aspiration and negative test dose prior to incremental administration of local anesthetic. The patient tolerated the procedure well.      Procedure Name: Intubation Date/Time: 11/23/2014 10:03 PM Performed by: Jerilee Hoh Pre-anesthesia Checklist: Patient identified, Emergency Drugs available, Suction available and Patient being monitored Patient Re-evaluated:Patient Re-evaluated prior to inductionOxygen Delivery Method: Circle system utilized Preoxygenation: Pre-oxygenation with 100% oxygen Intubation Type: IV induction, Rapid sequence and Cricoid Pressure applied Laryngoscope Size: Mac and 4 Grade View: Grade I Tube type: Oral Tube size: 7.5 mm Number of attempts: 1 Airway Equipment and Method: Stylet Placement Confirmation: ETT inserted through vocal cords under direct vision,  positive ETCO2 and breath sounds checked- equal and bilateral Secured  at: 22 cm Tube secured with: Tape Dental Injury: Teeth and Oropharynx as per pre-operative assessment

## 2014-11-23 NOTE — Progress Notes (Signed)
Patient reports eating a sub and water at 1330. Per Dr. Renold Don stated patient needed NPO for a full eight hours (surgery time at 2130.) OR desk notified of same.

## 2014-11-23 NOTE — Progress Notes (Signed)
Report given to Eunice Blase, Charity fundraiser. Raynelle Fanning, CRNA notified that patient needs PO tylenol prior to surgery.

## 2014-11-23 NOTE — Anesthesia Preprocedure Evaluation (Addendum)
Anesthesia Evaluation  Patient identified by MRN, date of birth, ID band Patient awake    Reviewed: Allergy & Precautions, NPO status , Patient's Chart, lab work & pertinent test results  Airway Mallampati: II  TM Distance: >3 FB Neck ROM: Full    Dental  (+) Teeth Intact   Pulmonary Current Smoker,    breath sounds clear to auscultation       Cardiovascular negative cardio ROS   Rhythm:Regular Rate:Normal     Neuro/Psych negative neurological ROS  negative psych ROS   GI/Hepatic negative GI ROS, Neg liver ROS,   Endo/Other  negative endocrine ROSHyperthyroidism   Renal/GU negative Renal ROS  negative genitourinary   Musculoskeletal negative musculoskeletal ROS (+)   Abdominal   Peds negative pediatric ROS (+)  Hematology negative hematology ROS (+)   Anesthesia Other Findings   Reproductive/Obstetrics negative OB ROS                            Lab Results  Component Value Date   HGB 16.4 01/23/2013   No results found for: CREATININE, BUN, NA, K, CL, CO2;  No results found for: INR, PROTIME   Anesthesia Physical Anesthesia Plan  ASA: I and emergent  Anesthesia Plan: General   Post-op Pain Management:    Induction: Intravenous  Airway Management Planned: Oral ETT  Additional Equipment:   Intra-op Plan:   Post-operative Plan: Extubation in OR  Informed Consent: I have reviewed the patients History and Physical, chart, labs and discussed the procedure including the risks, benefits and alternatives for the proposed anesthesia with the patient or authorized representative who has indicated his/her understanding and acceptance.   Dental advisory given  Plan Discussed with: CRNA  Anesthesia Plan Comments: (Anesthetic plan discussed in detail. Associated risk discussed including but not limited to life threatening cardiovascular, pulmonary events and dental damage. The  postoperative pain management and antiemetic plan discussed with patient. All questions answered in detail. Patient is in agreement.   )       Anesthesia Quick Evaluation

## 2014-11-23 NOTE — Progress Notes (Signed)
Patient denies having money. Reports having wallet with debit card, cell phone, jewelry as noted taken to PACU. Patient notified that we could not be responsible for same verbalized understanding and ok with same going to PACU

## 2014-11-23 NOTE — H&P (Signed)
PREOPERATIVE H&P  Chief Complaint: distal radius fracture  HPI: Henry Walter is a 24 y.o. male who presents for preoperative history and physical with a diagnosis of distal radius fracture. Symptoms are rated as moderate to severe, and have been worsening.  This is significantly impairing activities of daily living.  He has elected for surgical management.   Past Medical History  Diagnosis Date  . Fracture, metacarpal, neck 12/31/2012    right index  . H/O fracture of nose 12/21/2012   Past Surgical History  Procedure Laterality Date  . No past surgeries    . Open reduction internal fixation (orif) metacarpal Right 01/23/2013    Procedure: RIGHT INDEX FINGER OPEN REDUCTION INTERNAL FIXATION (ORIF) METACARPAL ;  Surgeon: Sharma Covert, MD;  Location: [facility];  Service: Orthopedics;  Laterality: Right;   Social History   Social History  . Marital Status: Single    Spouse Name: N/A  . Number of Children: N/A  . Years of Education: N/A   Social History Main Topics  . Smoking status: Current Every Day Smoker -- 0.50 packs/day for 6 years    Types: Cigarettes  . Smokeless tobacco: Current User    Types: Chew  . Alcohol Use: Yes     Comment: occasionally  . Drug Use: No  . Sexual Activity: Not on file   Other Topics Concern  . Not on file   Social History Narrative  . No narrative on file   No family history on file. Allergies  Allergen Reactions  . Hydrocodone Nausea And Vomiting  . Percocet [Oxycodone-Acetaminophen] Itching   Prior to Admission medications   Medication Sig Start Date End Date Taking? Authorizing Provider  docusate sodium (COLACE) 100 MG capsule Take 1 capsule (100 mg total) by mouth 2 (two) times daily. 01/23/13   Bradly Bienenstock, MD  ondansetron (ZOFRAN) 4 MG tablet Take 1 tablet (4 mg total) by mouth every 8 (eight) hours as needed for nausea or vomiting. 01/23/13   Bradly Bienenstock, MD  oxyCODONE-acetaminophen (PERCOCET) 10-325 MG per  tablet Take 1 tablet by mouth every 4 (four) hours as needed for pain. 01/23/13   Bradly Bienenstock, MD     Positive ROS: All other systems have been reviewed and were otherwise negative with the exception of those mentioned in the HPI and as above.  Physical Exam: General: Alert, no acute distress Cardiovascular: No pedal edema Respiratory: No cyanosis, no use of accessory musculature GI: No organomegaly, abdomen is soft and non-tender Skin: No lesions in the area of chief complaint Neurologic: Sensation intact distally Psychiatric: Patient is competent for consent with normal mood and affect Lymphatic: No axillary or cervical lymphadenopathy  MUSCULOSKELETAL:  L wrist has moderate swelling and ecchymosis.  Splint intact. Decreased sensation over the radial nerve innervation of the thumb. 2+ distal pulses.   Assessment: Left distal radius fracture  Plan: Plan for Procedure(s): OPEN REDUCTION INTERNAL FIXATION (ORIF) DISTAL RADIAL FRACTURE WITH CARPAL TUNNEL RELEASE  The risks benefits and alternatives were discussed with the patient including but not limited to the risks of nonoperative treatment, versus surgical intervention including infection, bleeding, nerve injury,  blood clots, cardiopulmonary complications, morbidity, mortality, among others, and they were willing to proceed.   Lynann Bologna, PA-C  11/23/2014 4:14 PM

## 2014-11-24 ENCOUNTER — Encounter (HOSPITAL_COMMUNITY): Payer: Self-pay | Admitting: Orthopedic Surgery

## 2014-11-24 ENCOUNTER — Observation Stay (HOSPITAL_COMMUNITY): Payer: BLUE CROSS/BLUE SHIELD

## 2014-11-24 DIAGNOSIS — S52515A Nondisplaced fracture of left radial styloid process, initial encounter for closed fracture: Secondary | ICD-10-CM | POA: Diagnosis not present

## 2014-11-24 DIAGNOSIS — S52502A Unspecified fracture of the lower end of left radius, initial encounter for closed fracture: Secondary | ICD-10-CM | POA: Diagnosis present

## 2014-11-24 MED ORDER — HYDROMORPHONE HCL 1 MG/ML IJ SOLN
0.2500 mg | INTRAMUSCULAR | Status: DC | PRN
  Administered 2014-11-24 (×2): 0.5 mg via INTRAVENOUS

## 2014-11-24 MED ORDER — METHOCARBAMOL 1000 MG/10ML IJ SOLN: 500.0000 mg | Freq: Four times a day (QID) | INTRAVENOUS | Status: DC | PRN

## 2014-11-24 MED ORDER — CEFAZOLIN SODIUM-DEXTROSE 2-3 GM-% IV SOLR
2.0000 g | Freq: Four times a day (QID) | INTRAVENOUS | Status: DC
  Administered 2014-11-24 (×2): 2 g via INTRAVENOUS
  Filled 2014-11-24 (×3): qty 50

## 2014-11-24 MED ORDER — LACTATED RINGERS IV SOLN: INTRAVENOUS | Status: DC

## 2014-11-24 MED ORDER — OXYCODONE HCL 5 MG PO TABS
5.0000 mg | ORAL_TABLET | ORAL | Status: DC | PRN
  Administered 2014-11-24 (×2): 10 mg via ORAL
  Filled 2014-11-24 (×2): qty 2

## 2014-11-24 MED ORDER — HYDROMORPHONE HCL 1 MG/ML IJ SOLN
1.0000 mg | INTRAMUSCULAR | Status: DC | PRN
  Administered 2014-11-24 (×2): 1 mg via INTRAVENOUS
  Filled 2014-11-24 (×2): qty 1

## 2014-11-24 MED ORDER — MEPERIDINE HCL 25 MG/ML IJ SOLN: 6.2500 mg | INTRAMUSCULAR | Status: DC | PRN

## 2014-11-24 MED ORDER — HYDROMORPHONE HCL 1 MG/ML IJ SOLN
INTRAMUSCULAR | Status: AC
  Filled 2014-11-24: qty 1

## 2014-11-24 MED ORDER — PROMETHAZINE HCL 25 MG/ML IJ SOLN: 6.2500 mg | INTRAMUSCULAR | Status: DC | PRN

## 2014-11-24 MED ORDER — DOCUSATE SODIUM 100 MG PO CAPS: 100.0000 mg | ORAL_CAPSULE | Freq: Two times a day (BID) | ORAL | Status: DC

## 2014-11-24 MED ORDER — POLYETHYLENE GLYCOL 3350 17 G PO PACK: 17.0000 g | PACK | Freq: Every day | ORAL | Status: DC | PRN

## 2014-11-24 MED ORDER — METHOCARBAMOL 500 MG PO TABS
500.0000 mg | ORAL_TABLET | Freq: Four times a day (QID) | ORAL | Status: DC | PRN
  Administered 2014-11-24: 500 mg via ORAL

## 2014-11-24 MED ORDER — ONDANSETRON HCL 4 MG PO TABS: 4.0000 mg | ORAL_TABLET | Freq: Four times a day (QID) | ORAL | Status: DC | PRN

## 2014-11-24 MED ORDER — METOCLOPRAMIDE HCL 5 MG PO TABS
5.0000 mg | ORAL_TABLET | Freq: Three times a day (TID) | ORAL | Status: DC | PRN
Start: 2014-11-24 — End: 2014-11-24

## 2014-11-24 MED ORDER — ONDANSETRON HCL 4 MG PO TABS: 4.0000 mg | ORAL_TABLET | Freq: Three times a day (TID) | ORAL | Status: DC | PRN

## 2014-11-24 MED ORDER — ONDANSETRON HCL 4 MG/2ML IJ SOLN: 4.0000 mg | Freq: Four times a day (QID) | INTRAMUSCULAR | Status: DC | PRN

## 2014-11-24 MED ORDER — DOCUSATE SODIUM 100 MG PO CAPS
100.0000 mg | ORAL_CAPSULE | Freq: Two times a day (BID) | ORAL | Status: DC
  Administered 2014-11-24: 100 mg via ORAL
  Filled 2014-11-24: qty 1

## 2014-11-24 MED ORDER — METOCLOPRAMIDE HCL 5 MG/ML IJ SOLN: 5.0000 mg | Freq: Three times a day (TID) | INTRAMUSCULAR | Status: DC | PRN

## 2014-11-24 MED ORDER — OXYCODONE-ACETAMINOPHEN 5-325 MG PO TABS: 1.0000 | ORAL_TABLET | ORAL | Status: DC | PRN

## 2014-11-24 MED ORDER — METHOCARBAMOL 500 MG PO TABS
ORAL_TABLET | ORAL | Status: AC
  Filled 2014-11-24: qty 1

## 2014-11-24 MED ORDER — POTASSIUM CHLORIDE IN NACL 20-0.45 MEQ/L-% IV SOLN
INTRAVENOUS | Status: DC
Start: 2014-11-24 — End: 2014-11-24
  Administered 2014-11-24: 03:00:00 via INTRAVENOUS
  Filled 2014-11-24 (×3): qty 1000

## 2014-11-24 NOTE — Transfer of Care (Signed)
Immediate Anesthesia Transfer of Care Note  Patient: Henry Walter  Procedure(s) Performed: Procedure(s): OPEN REDUCTION INTERNAL FIXATION (ORIF) DISTAL RADIAL FRACTURE WITH CARPAL TUNNEL RELEASE (Left)  Patient Location: PACU  Anesthesia Type:General and GA combined with regional for post-op pain  Level of Consciousness: awake  Airway & Oxygen Therapy: Patient Spontanous Breathing and Patient connected to nasal cannula oxygen  Post-op Assessment: Report given to RN and Post -op Vital signs reviewed and stable  Post vital signs: Reviewed and stable  Last Vitals:  Filed Vitals:   11/23/14 1656  BP: 141/88  Pulse: 96  Temp: 36.8 C  Resp: 18    Complications: No apparent anesthesia complications

## 2014-11-24 NOTE — Op Note (Signed)
11/23/2014  12:06 AM  PATIENT:  Henry Walter    PRE-OPERATIVE DIAGNOSIS:  distal radius fracture-left  POST-OPERATIVE DIAGNOSIS:  Same  PROCEDURE:  OPEN REDUCTION INTERNAL FIXATION (ORIF) DISTAL RADIAL FRACTURE WITH CARPAL TUNNEL RELEASE  SURGEON:  MURPHY, Jewel Baize, MD  ASSISTANT: Janalee Dane, PA-C, She was present and scrubbed throughout the case, critical for completion in a timely fashion, and for retraction, instrumentation, and closure.   ANESTHESIA:   gen + block  PREOPERATIVE INDICATIONS:  Henry Walter is a  24 y.o. male with a diagnosis of distal radius fracture-left who failed conservative measures and elected for surgical management.    The risks benefits and alternatives were discussed with the patient preoperatively including but not limited to the risks of infection, bleeding, nerve injury, cardiopulmonary complications, the need for revision surgery, among others, and the patient was willing to proceed.  He demonstrated carpal tunnel symptoms including a sensate portion of his thumb and pain in his median nerve distribution. I discussed the risks and benefits of the above surgery including a carpal tunnel release for an acute carpal tunnel syndrome I felt this should be done urgently.  OPERATIVE IMPLANTS: DVR plate  OPERATIVE FINDINGS: significant comminution  BLOOD LOSS: min  COMPLICATIONS: none  TOURNIQUET TIME:  OPERATIVE PROCEDURE:  Patient was identified in the preoperative holding area and site was marked by me He was transported to the operating theater and placed on the table in supine position taking care to pad all bony prominences. After a preincinduction time out anesthesia was induced. The left upper extremity was prepped and draped in normal sterile fashion and a pre-incision timeout was performed. He received ancef for preoperative antibiotics.     I made a 5 cm incision centered over her FCR tendon and dissected down carefully to the  level of the flexor tendon sheath and incise this longitudinally and retracted the FCR radially and incised the dorsal aspect of the sheath. I continued the incision distally obliquely crossing his wrist flexor crease. I initially worked on the carpal tunnel release.  I used blunt spreading and identified as transverse carpal ligament I sharply incised this visualized his nerve and confirmed no trauma or surgical trauma to the nerve itself. I confirmed complete release of his carpal tunnel. Next I turned my attention to his distal radius.  I bluntly dissected the FPL muscle belly away from the brachioradialis and then sharply incised the pronator tendon from the distal radius and from the wrist capsule. I Elevated this off the bone the fractures visible.   I released the brachioradialis from its insertion. I then debrided the fracture and performed a manual reduction. There was a large amount of comminution to his radial styloid  I selected a plate and I placed it on the bone. I pinned it into place and was happy on multiple radiographic views with it's placement. I then fixed the plate distally with the locking pegs. I confirmed no articular penetration with the pegs and that none were prominent dorsally.   I then reduced the plate to the shaft improving the volar and radial tilt of her distal radius.  I was happy with the final fluoro xrays    I thoroughly irrigated the wound and closed the pronator over top of the plate and then closed the skin in layers with absorbable stitch. Sterile dressing was applied using the PACU in stable condition.  POST OPERATIVE PLAN: NWB, Splint full time. Ambulate for DVT px.  This note was generated using a template and dragon dictation system. In light of that, I have reviewed the note and all aspects of it are applicable to this case. Any dictation errors are due to the computerized dictation system.

## 2014-11-24 NOTE — Progress Notes (Signed)
NURSING PROGRESS NOTE  Henry Walter [id number] Discharge Data: 11/24/2014 11:22 AM Attending Provider: Sheral Apley, MD PCP:No PCP Per Patient     [name] to be D/C'd Home per MD order.  Discussed with the patient the After Visit Summary and all questions fully answered. All IV's discontinued with no bleeding noted. All belongings returned to patient for patient to take home.   Last Vital Signs:  Blood pressure 124/78, pulse 99, temperature 98.6 F (37 C), temperature source Oral, resp. rate 16, height  (1.803 m), weight 111.131 kg (245 lb), SpO2 92 %.  Discharge Medication List   Medication List    STOP taking these medications        ibuprofen 200 MG tablet  Commonly known as:  ADVIL,MOTRIN     oxyCODONE 5 MG immediate release tablet  Commonly known as:  Oxy IR/ROXICODONE     oxyCODONE-acetaminophen 10-325 MG per tablet  Commonly known as:  PERCOCET  Replaced by:  oxyCODONE-acetaminophen 5-325 MG per tablet      TAKE these medications        docusate sodium 100 MG capsule  Commonly known as:  COLACE  Take 1 capsule (100 mg total) by mouth 2 (two) times daily.  Notes to Patient:  Stool softener to help prevent constipation from pain medications     ondansetron 4 MG tablet  Commonly known as:  ZOFRAN  Take 1 tablet (4 mg total) by mouth every 8 (eight) hours as needed for nausea or vomiting.     OVER THE COUNTER MEDICATION  Take 1 capsule by mouth daily. SST dietary supplement for weight loss (caffeine capsules)     oxyCODONE-acetaminophen 5-325 MG per tablet  Commonly known as:  PERCOCET  Take 1-2 tablets by mouth every 4 (four) hours as needed for severe pain.         Bennie Pierini, RN

## 2014-11-24 NOTE — Anesthesia Postprocedure Evaluation (Signed)
  Anesthesia Post-op Note  Patient: Henry Walter  Procedure(s) Performed: Procedure(s): OPEN REDUCTION INTERNAL FIXATION (ORIF) DISTAL RADIAL FRACTURE WITH CARPAL TUNNEL RELEASE (Left)  Patient Location: PACU  Anesthesia Type:General  Level of Consciousness: awake, alert  and oriented  Airway and Oxygen Therapy: Patient Spontanous Breathing  Post-op Pain: none  Post-op Assessment: Post-op Vital signs reviewed and Patient's Cardiovascular Status Stable              Post-op Vital Signs: Reviewed and stable  Last Vitals:  Filed Vitals:   11/24/14 0045  BP:   Pulse: 95  Temp:   Resp: 19    Complications: No apparent anesthesia complications

## 2014-11-24 NOTE — Progress Notes (Signed)
     Subjective:  POD#1 ORIF L distal radius with carpal tunnel release.  Patient reports pain as moderate.  Resting comfortably in bed this morning with family at the bedside.    Objective:   VITALS:   Filed Vitals:   11/24/14 0135 11/24/14 0224 11/24/14 0516 11/24/14 0648  BP: 143/108 142/93  124/78  Pulse: 88 104  99  Temp: 98.6 F (37 C) 100.2 F (37.9 C) 97.9 F (36.6 C) 98.6 F (37 C)  TempSrc:  Oral Oral Oral  Resp: Height:      Weight:      SpO2: 93% 97%  92%    Neurologically intact ABD soft Neurovascular intact Sensation intact distally Intact pulses distally Splint to the L wrist  Lab Results  Component Value Date   HGB 16.4 01/23/2013   BMET No results found for: NA, K, CL, CO2, GLUCOSE, BUN, CREATININE, CALCIUM, GFRNONAA, GFRAA   Assessment/Plan: 1 Day Post-Op   Active Problems:   Distal radius fracture, left  Plan to discharge home today.  Will see back in the clinic in 10 days for follow up.  NWB in the wrist.  Will call the office to get a work note.  Will be out of work for at least 6 weeks.  Kelly,Brittney Hilda Lias 11/24/2014, 10:12 AM Cell [phone]

## 2014-11-24 NOTE — Discharge Summary (Signed)
Physician Discharge Summary  Patient ID: Henry Walter MRN: [id number] DOB/AGE: 11-23-1990 24 y.o.  Admit date: 11/23/2014 Discharge date: 11/24/2014  Admission Diagnoses:  Distal radius fracture, left  Discharge Diagnoses:  Principal Problem:   Distal radius fracture, left   Past Medical History  Diagnosis Date  . Fracture, metacarpal, neck 12/31/2012    right index  . H/O fracture of nose 12/21/2012  . Complication of anesthesia     Reports being "mean" when he woke up    Surgeries: Procedure(s): OPEN REDUCTION INTERNAL FIXATION (ORIF) DISTAL RADIAL FRACTURE WITH CARPAL TUNNEL RELEASE on 11/23/2014 - 11/24/2014   Consultants (if any):    Discharged Condition: Improved  Hospital Course: Henry Walter is an 24 y.o. male who was admitted 11/23/2014 with a diagnosis of Distal radius fracture, left and went to the operating room on 11/23/2014 - 11/24/2014 and underwent the above named procedures.    He was given perioperative antibiotics:      Anti-infectives    Start     Dose/Rate Route Frequency Ordered Stop   11/24/14 0600  ceFAZolin (ANCEF) IVPB 2 g/50 mL premix     2 g 100 mL/hr over 30 Minutes Intravenous On call to O.R. 11/23/14 1640 11/23/14 2210   11/24/14 0300  ceFAZolin (ANCEF) IVPB 2 g/50 mL premix     2 g 100 mL/hr over 30 Minutes Intravenous Every 6 hours 11/24/14 0155 11/24/14 2059    .  He was given sequential compression devices and early ambulation for DVT prophylaxis.  He benefited maximally from the hospital stay and there were no complications.    Recent vital signs:  Filed Vitals:   11/24/14 0648  BP: 124/78  Pulse: 99  Temp: 98.6 F (37 C)  Resp: 16    Recent laboratory studies:  Lab Results  Component Value Date   HGB 16.4 01/23/2013   No results found for: WBC, PLT No results found for: INR No results found for: NA, K, CL, CO2, BUN, CREATININE, GLUCOSE  Discharge Medications:     Medication List    STOP taking these  medications        ibuprofen 200 MG tablet  Commonly known as:  ADVIL,MOTRIN     oxyCODONE 5 MG immediate release tablet  Commonly known as:  Oxy IR/ROXICODONE     oxyCODONE-acetaminophen 10-325 MG per tablet  Commonly known as:  PERCOCET  Replaced by:  oxyCODONE-acetaminophen 5-325 MG per tablet      TAKE these medications        docusate sodium 100 MG capsule  Commonly known as:  COLACE  Take 1 capsule (100 mg total) by mouth 2 (two) times daily.     ondansetron 4 MG tablet  Commonly known as:  ZOFRAN  Take 1 tablet (4 mg total) by mouth every 8 (eight) hours as needed for nausea or vomiting.     OVER THE COUNTER MEDICATION  Take 1 capsule by mouth daily. SST dietary supplement for weight loss (caffeine capsules)     oxyCODONE-acetaminophen 5-325 MG per tablet  Commonly known as:  PERCOCET  Take 1-2 tablets by mouth every 4 (four) hours as needed for severe pain.        Diagnostic Studies: Wrist 2 Views Left  11/24/2014   CLINICAL DATA:  Postop  EXAM: LEFT WRIST - 2 VIEW  COMPARISON:  None.  FINDINGS: Status post ORIF of a distal radial fracture.  Ulnar styloid fracture, nondisplaced.  Associated soft tissue swelling.  Overlying splint  obscures fine osseous detail.  IMPRESSION: Status post ORIF of a distal radial fracture.  Ulnar styloid fracture, nondisplaced.  Overlying splint.   Electronically Signed   By: Charline Bills M.D.   On: 11/24/2014 04:48    Disposition: 01-Home or Self Care  Discharge Instructions    Non weight bearing    Complete by:  As directed   Laterality:  left  Extremity:  Upper           Follow-up Information    Follow up with MURPHY, TIMOTHY D, MD In 10 days.   Specialty:  Orthopedic Surgery   Contact information:   [address] [place] [address] [phone]        Signed: Lynann Bologna 11/24/2014, 10:15 AM Cell [phone]

## 2014-11-24 NOTE — Discharge Instructions (Signed)
Keep splint clean and dry until follow up  Non-weight bearing in the L wrist

## 2015-02-01 ENCOUNTER — Ambulatory Visit (INDEPENDENT_AMBULATORY_CARE_PROVIDER_SITE_OTHER): Payer: BLUE CROSS/BLUE SHIELD | Admitting: Family Medicine

## 2015-02-01 ENCOUNTER — Telehealth: Payer: Self-pay | Admitting: Family Medicine

## 2015-02-01 ENCOUNTER — Encounter: Payer: Self-pay | Admitting: Family Medicine

## 2015-02-01 VITALS — BP 131/93 | HR 103 | Temp 98.4°F | Resp 16 | Ht 69.5 in | Wt 196.0 lb

## 2015-02-01 DIAGNOSIS — R350 Frequency of micturition: Secondary | ICD-10-CM

## 2015-02-01 DIAGNOSIS — R634 Abnormal weight loss: Secondary | ICD-10-CM

## 2015-02-01 DIAGNOSIS — Z7189 Other specified counseling: Secondary | ICD-10-CM | POA: Diagnosis not present

## 2015-02-01 DIAGNOSIS — H538 Other visual disturbances: Secondary | ICD-10-CM

## 2015-02-01 DIAGNOSIS — R682 Dry mouth, unspecified: Secondary | ICD-10-CM

## 2015-02-01 DIAGNOSIS — Z7689 Persons encountering health services in other specified circumstances: Secondary | ICD-10-CM

## 2015-02-01 LAB — POCT URINALYSIS DIPSTICK
BILIRUBIN UA: NEGATIVE
GLUCOSE UA: 500
Ketones, UA: 40
LEUKOCYTES UA: NEGATIVE
NITRITE UA: NEGATIVE
Protein, UA: NEGATIVE
RBC UA: NEGATIVE
Spec Grav, UA: 1.015
UROBILINOGEN UA: 0.2
pH, UA: 5.5

## 2015-02-01 LAB — CBC WITH DIFFERENTIAL/PLATELET
BASOS ABS: 0 10*3/uL (ref 0.0–0.1)
Basophils Relative: 0.5 % (ref 0.0–3.0)
EOS ABS: 0 10*3/uL (ref 0.0–0.7)
Eosinophils Relative: 0.8 % (ref 0.0–5.0)
HEMATOCRIT: 50 % (ref 39.0–52.0)
Hemoglobin: 17 g/dL (ref 13.0–17.0)
LYMPHS ABS: 2.1 10*3/uL (ref 0.7–4.0)
LYMPHS PCT: 41.1 % (ref 12.0–46.0)
MCHC: 34.1 g/dL (ref 30.0–36.0)
MCV: 88.2 fl (ref 78.0–100.0)
MONOS PCT: 7.1 % (ref 3.0–12.0)
Monocytes Absolute: 0.4 10*3/uL (ref 0.1–1.0)
NEUTROS ABS: 2.6 10*3/uL (ref 1.4–7.7)
NEUTROS PCT: 50.5 % (ref 43.0–77.0)
PLATELETS: 320 10*3/uL (ref 150.0–400.0)
RBC: 5.67 Mil/uL (ref 4.22–5.81)
RDW: 13.1 % (ref 11.5–15.5)
WBC: 5.2 10*3/uL (ref 4.0–10.5)

## 2015-02-01 LAB — COMPREHENSIVE METABOLIC PANEL
ALBUMIN: 5.1 g/dL (ref 3.5–5.2)
ALT: 20 U/L (ref 0–53)
AST: 13 U/L (ref 0–37)
Alkaline Phosphatase: 127 U/L — ABNORMAL HIGH (ref 39–117)
BUN: 8 mg/dL (ref 6–23)
CALCIUM: 10.3 mg/dL (ref 8.4–10.5)
CHLORIDE: 94 meq/L — AB (ref 96–112)
CO2: 25 mEq/L (ref 19–32)
CREATININE: 0.73 mg/dL (ref 0.40–1.50)
GFR: 138.61 mL/min (ref >60.00)
Glucose, Bld: 470 mg/dL — ABNORMAL HIGH (ref 70–99)
Potassium: 4 mEq/L (ref 3.5–5.1)
Sodium: 134 mEq/L — ABNORMAL LOW (ref 135–145)
TOTAL PROTEIN: 7.6 g/dL (ref 6.0–8.3)
Total Bilirubin: 0.7 mg/dL (ref 0.2–1.2)

## 2015-02-01 LAB — TSH: TSH: 1.53 u[IU]/mL (ref 0.35–4.50)

## 2015-02-01 LAB — T4, FREE: FREE T4: 1.01 ng/dL (ref 0.60–1.60)

## 2015-02-01 LAB — HEMOGLOBIN A1C: HEMOGLOBIN A1C: 13.5 % — AB (ref 4.6–6.5)

## 2015-02-01 NOTE — Telephone Encounter (Signed)
Please call patient, I attempted to call him this evening to discuss his laboratory results. I would like to have him come in today (Tuesday) and discuss all of his lab work, and medication needs. Patient is a diabetic by his lab results from yesterday, and we need to start him on medication immediately.

## 2015-02-01 NOTE — Progress Notes (Signed)
Subjective:    Patient ID: Henry Walter, male    DOB: 1990/08/16, 24 y.o.   MRN: [id number]  HPI Patient presents for new patient establishment with multiple complaints. All past medical history, surgical history, allergies, family history, immunizations and social history was obtained from the patient today and entered into the electronic medical record. Records are requested from her prior PCP, and will be reviewed at the time they are received. All medical records will be updated at that time.  Weight loss: Pt states he has been losing weight, unintentionally. Prior weight at [facility] 2 months ago was 245 lbs. Pt states this was accurate. He did endorse taking a diet pill (OTC) prior to that, but not since September. He is not trying to lose weight at this time. He endorses urinary frequency without dysuria, no fever, chills, night sweats or fatigue.  He endorses dry mouth  And increased thirst. He also endorses mld intermittent blurred vision. His mother has recently passed from ? Kidney cancer. He states she was diagnosed and passed away within 3 months at the beginning of this year, kidneys were "honeycombed".  Patient denies any neuropathy or non-healing wounds.   Current everyday smoker Past Medical History  Diagnosis Date  . Fracture, metacarpal, neck 12/31/2012    right index  . H/O fracture of nose 12/21/2012  . Complication of anesthesia     Reports being "mean" when he woke up   Allergies  Allergen Reactions  . Hydrocodone Nausea And Vomiting   Past Surgical History  Procedure Laterality Date  . Open reduction internal fixation (orif) metacarpal Right 01/23/2013    Procedure: RIGHT INDEX FINGER OPEN REDUCTION INTERNAL FIXATION (ORIF) METACARPAL ;  Surgeon: Linna Hoff, MD;  Location: [facility];  Service: Orthopedics;  Laterality: Right;  . Open reduction internal fixation (orif) distal radial fracture Left 11/23/2014    Procedure: OPEN REDUCTION  INTERNAL FIXATION (ORIF) DISTAL RADIAL FRACTURE WITH CARPAL TUNNEL RELEASE;  Surgeon: Renette Butters, MD;  Location: [facility];  Service: Orthopedics;  Laterality: Left;  . Hand surgery Left 11/23/2014   Family History  Problem Relation Age of Onset  . Cancer Mother     adrenal gland cancer  . Heart attack Father   . Hypertension Father   . Stroke Maternal Grandfather   . Cancer Paternal Grandfather     lung cancer   Social History   Social History  . Marital Status: Single    Spouse Name: N/A  . Number of Children: N/A  . Years of Education: N/A   Occupational History  . Not on file.   Social History Main Topics  . Smoking status: Current Every Day Smoker -- 0.50 packs/day for 6 years    Types: Cigarettes  . Smokeless tobacco: Former Systems developer    Types: Chew  . Alcohol Use: No     Comment: quit drinking  . Drug Use: Yes    Special: Marijuana  . Sexual Activity: Yes    Birth Control/ Protection: Condom   Other Topics Concern  . Not on file   Social History Narrative   HS grad. [employer]. Single.    Denies alcohol. Admits to Marijuana and tobacco.    caffeinated beverages.    Does not wear seatbelt.    Exercises 3x a week.    Smoke detector in the home.    Firearms in the home. Locked cabinet.    Safe in his relationships.    Review of  Systems Negative, with the exception of above mentioned in HPI     Objective:   Physical Exam BP 131/93 mmHg  Pulse 103  Temp(Src) 98.4 F (36.9 C) (Temporal)  Resp 16  Ht 5' 9.5" (1.765 m)  Wt 196 lb (88.905 kg)  BMI 28.54 kg/m2  SpO2 94% Gen: Afebrile. No acute distress. Nontoxic appearance, well-developed, well-nourished, mildly overweight, Caucasian male. Pleasant.  HENT: AT. [place]. Bilateral TM visualized and normal in appearance. MMM, no oral lesions. Bilateral nares without erythema or swelling. Throat without erythema or exudates. Good dentition. Eyes:Pupils Equal Round Reactive to light, Extraocular movements intact,   Conjunctiva without redness, discharge or icterus. Neck/lymp/endocrine: Supple, no lymphadenopathy, no thyromegaly CV: RRR no murmur appreciated, no edema, +2/4 P posterior tibialis pulses Chest: CTAB, no wheeze or crackles Abd: Soft. Flat. NTND. BS present. No Masses palpated. No hepatosplenomegaly. MSK: No erythema, no soft tissue swelling. No obvious deformities. Left wrist splint in place. Well-healed carpal tunnel incision left wrist. Skin: No rashes, purpura or petechiae.  Neuro: Normal gait. PERLA. EOMi. Alert. Oriented x3. Cranial nerves II through XII intact. Muscle strength 5/5 upper and lower extremity. DTRs equal bilaterally. Psych: Normal affect, dress and demeanor. Normal speech. Normal thought content and judgment..     Assessment & Plan:  1. Urinary frequency - POCT urinalysis dipstick--> positive for 500 glucose and ketones.  2. Loss of weight/dry mouth/blurry vision - Suspect new-onset diabetes will rule out thyroid disorder, diabetes, infection, HIV, liver or kidney disorder. - TSH - T4, free - HgB A1c - CBC w/Diff - Comp Met (CMET) - HIV antibody (with reflex) - C-peptide - Anti-islet cell antibody - Glutamic acid decarboxylase auto abs - Patient encouraged to decrease the sugar content in his diet, drink at least 70-80 ounces of water daily. - AVS on health maintenance and diabetes was provided to patient today. Discussed with patient the possibility of diabetes with glucose in his urine and his symptoms. Patient will be called with lab results, and likely will need to follow-up appointment within a a few days if needing to start medications. Consider endocrine referral.

## 2015-02-01 NOTE — Patient Instructions (Signed)
Health Maintenance, Male A healthy lifestyle and preventative care can promote health and wellness.  Maintain regular health, dental, and eye exams.  Eat a healthy diet. Foods like vegetables, fruits, whole grains, low-fat dairy products, and lean protein foods contain the nutrients you need and are low in calories. Decrease your intake of foods high in solid fats, added sugars, and salt. Get information about a proper diet from your health care provider, if necessary.  Regular physical exercise is one of the most important things you can do for your health. Most adults should get at least 150 minutes of moderate-intensity exercise (any activity that increases your heart rate and causes you to sweat) each week. In addition, most adults need muscle-strengthening exercises on 2 or more days a week.   Maintain a healthy weight. The body mass index (BMI) is a screening tool to identify possible weight problems. It provides an estimate of body fat based on height and weight. Your health care provider can find your BMI and can help you achieve or maintain a healthy weight. For males 20 years and older:  A BMI below 18.5 is considered underweight.  A BMI of 18.5 to 24.9 is normal.  A BMI of 25 to 29.9 is considered overweight.  A BMI of 30 and above is considered obese.  Maintain normal blood lipids and cholesterol by exercising and minimizing your intake of saturated fat. Eat a balanced diet with plenty of fruits and vegetables. Blood tests for lipids and cholesterol should begin at age 20 and be repeated every 5 years. If your lipid or cholesterol levels are high, you are over age 50, or you are at high risk for heart disease, you may need your cholesterol levels checked more frequently.Ongoing high lipid and cholesterol levels should be treated with medicines if diet and exercise are not working.  If you smoke, find out from your health care provider how to quit. If you do not use tobacco, do not  start.  Lung cancer screening is recommended for adults aged 55-80 years who are at high risk for developing lung cancer because of a history of smoking. A yearly low-dose CT scan of the lungs is recommended for people who have at least a 30-pack-year history of smoking and are current smokers or have quit within the past 15 years. A pack year of smoking is smoking an average of 1 pack of cigarettes a day for 1 year (for example, a 30-pack-year history of smoking could mean smoking 1 pack a day for 30 years or 2 packs a day for 15 years). Yearly screening should continue until the smoker has stopped smoking for at least 15 years. Yearly screening should be stopped for people who develop a health problem that would prevent them from having lung cancer treatment.  If you choose to drink alcohol, do not have more than 2 drinks per day. One drink is considered to be 12 oz (360 mL) of beer, 5 oz (150 mL) of wine, or 1.5 oz (45 mL) of liquor.  Avoid the use of street drugs. Do not share needles with anyone. Ask for help if you need support or instructions about stopping the use of drugs.  High blood pressure causes heart disease and increases the risk of stroke. High blood pressure is more likely to develop in:  People who have blood pressure in the end of the normal range (100-139/85-89 mm Hg).  People who are overweight or obese.  People who are African American.    If you are 18-39 years of age, have your blood pressure checked every 3-5 years. If you are 40 years of age or older, have your blood pressure checked every year. You should have your blood pressure measured twice--once when you are at a hospital or clinic, and once when you are not at a hospital or clinic. Record the average of the two measurements. To check your blood pressure when you are not at a hospital or clinic, you can use:  An automated blood pressure machine at a pharmacy.  A home blood pressure monitor.  If you are 45-79 years  old, ask your health care provider if you should take aspirin to prevent heart disease.  Diabetes screening involves taking a blood sample to check your fasting blood sugar level. This should be done once every 3 years after age 45 if you are at a normal weight and without risk factors for diabetes. Testing should be considered at a younger age or be carried out more frequently if you are overweight and have at least 1 risk factor for diabetes.  Colorectal cancer can be detected and often prevented. Most routine colorectal cancer screening begins at the age of 50 and continues through age 75. However, your health care provider may recommend screening at an earlier age if you have risk factors for colon cancer. On a yearly basis, your health care provider may provide home test kits to check for hidden blood in the stool. A small camera at the end of a tube may be used to directly examine the colon (sigmoidoscopy or colonoscopy) to detect the earliest forms of colorectal cancer. Talk to your health care provider about this at age 50 when routine screening begins. A direct exam of the colon should be repeated every 5-10 years through age 75, unless early forms of precancerous polyps or small growths are found.  People who are at an increased risk for hepatitis B should be screened for this virus. You are considered at high risk for hepatitis B if:  You were born in a country where hepatitis B occurs often. Talk with your health care provider about which countries are considered high risk.  Your parents were born in a high-risk country and you have not received a shot to protect against hepatitis B (hepatitis B vaccine).  You have HIV or AIDS.  You use needles to inject street drugs.  You live with, or have sex with, someone who has hepatitis B.  You are a man who has sex with other men (MSM).  You get hemodialysis treatment.  You take certain medicines for conditions like cancer, organ  transplantation, and autoimmune conditions.  Hepatitis C blood testing is recommended for all people born from 1945 through 1965 and any individual with known risk factors for hepatitis C.  Healthy men should no longer receive prostate-specific antigen (PSA) blood tests as part of routine cancer screening. Talk to your health care provider about prostate cancer screening.  Testicular cancer screening is not recommended for adolescents or adult males who have no symptoms. Screening includes self-exam, a health care provider exam, and other screening tests. Consult with your health care provider about any symptoms you have or any concerns you have about testicular cancer.  Practice safe sex. Use condoms and avoid high-risk sexual practices to reduce the spread of sexually transmitted infections (STIs).  You should be screened for STIs, including gonorrhea and chlamydia if:  You are sexually active and are younger than 24 years.  You   are older than 24 years, and your health care provider tells you that you are at risk for this type of infection.  Your sexual activity has changed since you were last screened, and you are at an increased risk for chlamydia or gonorrhea. Ask your health care provider if you are at risk.  If you are at risk of being infected with HIV, it is recommended that you take a prescription medicine daily to prevent HIV infection. This is called pre-exposure prophylaxis (PrEP). You are considered at risk if:  You are a man who has sex with other men (MSM).  You are a heterosexual man who is sexually active with multiple partners.  You take drugs by injection.  You are sexually active with a partner who has HIV.  Talk with your health care provider about whether you are at high risk of being infected with HIV. If you choose to begin PrEP, you should first be tested for HIV. You should then be tested every 3 months for as long as you are taking PrEP.  Use sunscreen. Apply  sunscreen liberally and repeatedly throughout the day. You should seek shade when your shadow is shorter than you. Protect yourself by wearing long sleeves, pants, a wide-brimmed hat, and sunglasses year round whenever you are outdoors.  Tell your health care provider of new moles or changes in moles, especially if there is a change in shape or color. Also, tell your health care provider if a mole is larger than the size of a pencil eraser.  A one-time screening for abdominal aortic aneurysm (AAA) and surgical repair of large AAAs by ultrasound is recommended for men aged 65-75 years who are current or former smokers.  Stay current with your vaccines (immunizations).   This information is not intended to replace advice given to you by your health care provider. Make sure you discuss any questions you have with your health care provider.   Document Released: 08/19/2007 Document Revised: 03/13/2014 Document Reviewed: 07/18/2010 Elsevier Interactive Patient Education 2016 [employer]  Type 1 Diabetes Mellitus, Adult Type 1 diabetes mellitus, often simply referred to as diabetes, is a long-term (chronic) disease. It occurs when the islet cells in the pancreas that make insulin (a hormone) are destroyed and can no longer make insulin. Insulin is needed to move sugars from food into the tissue cells. The tissue cells use the sugars for energy. In people with type 1 diabetes, the sugars build up in the blood instead of going into the tissue cells. As a result, high blood sugar (hyperglycemia) develops. Without insulin, the body breaks down fat cells for the needed energy. This breakdown of fat cells produces acid chemicals (ketones), which increases the acid levels in the body. The effect of either high ketone or high sugar (glucose) levels can be life-threatening.  Type 1 diabetes was also previously called juvenile diabetes. It most often occurs before the age of 230, but it can occur at any age. RISK  FACTORS A person is predisposed to developing type 1 diabetes if someone in his or her family has the disease and is exposed to certain additional environmental triggers.  SYMPTOMS  Symptoms of type 1 diabetes may develop gradually over days to weeks or suddenly. The symptoms occur due to hyperglycemia. The symptoms can include:   Increased thirst (polydipsia).  Increased urination (polyuria).  Increased urination during the night (nocturia).  Weight loss. This weight loss may be rapid.  Frequent, recurring infections.  Tiredness (fatigue).  Weakness.  Vision changes, such as blurred vision.  Fruity smell to your breath.  Abdominal pain.  Nausea or vomiting.  An open skin wound (ulcer). DIAGNOSIS  Type 1 diabetes is diagnosed when symptoms of diabetes are present and when blood glucose levels are increased. Your blood glucose level may be checked by one or more of the following blood tests:  A fasting blood glucose test. You will not be allowed to eat for at least 8 hours before a blood sample is taken.  A random blood glucose test. Your blood glucose is checked at any time of the day regardless of when you ate.  A hemoglobin A1c blood glucose test. A hemoglobin A1c test provides information about blood glucose control over the previous 3 months. TREATMENT  Although type 1 diabetes cannot be prevented, it can be managed with insulin, diet, and exercise.  You will need to take insulin daily to keep blood glucose in the desired range.  You will need to match insulin dosing with exercise and healthy food choices. Generally, the goal of treatment is to maintain a pre-meal (preprandial) blood glucose level of 80-130 mg/dL. HOME CARE INSTRUCTIONS   Have your hemoglobin A1c level checked twice a year.  Perform daily blood glucose monitoring as directed by your health care provider.  Monitor urine ketones when you are ill and as directed by your health care provider.  Take  your insulin as directed by your health care provider to maintain your blood glucose level in the desired range.  Never run out of insulin. It is needed every day.  Adjust insulin based on your intake of carbohydrates. Carbohydrates can raise blood glucose levels but need to be included in your diet. Carbohydrates provide vitamins, minerals, and fiber, which are an essential part of a healthy diet. Carbohydrates are found in fruits, vegetables, whole grains, dairy products, legumes, and foods containing added sugars.  Eat healthy foods. Alternate 3 meals with 3 snacks.  Maintain a healthy weight.  Carry a medical alert card or wear your medical alert jewelry.  Carry a 15-gram carbohydrate snack with you at all times to treat low blood glucose (hypoglycemia). Some examples of 15-gram carbohydrate snacks include:  Glucose tablets, 3 or 4.  Glucose gel, 15-gram tube.  Raisins, 2 tablespoons (24 grams).  Jelly beans, 6.  Animal crackers, 8.  Fruit juice, regular soda, or low-fat milk, 4 ounces (120 mL).  Gummy treats, 9.  Recognize hypoglycemia. Hypoglycemia occurs with blood glucose levels of 70 mg/dL and below. The risk for hypoglycemia increases when fasting or skipping meals, during or after intense exercise, and during sleep. Hypoglycemia symptoms can include:  Tremors or shakes.  Decreased ability to concentrate.  Sweating.  Increased heart rate.  Headache.  Dry mouth.  Hunger.  Irritability.  Anxiety.  Restless sleep.  Altered speech or coordination.  Confusion.  Treat hypoglycemia promptly. If you are alert and able to safely swallow, follow the 15:15 rule:  Take 15-20 grams of rapid-acting glucose or carbohydrate. Rapid-acting options include glucose gel, glucose tablets, or 4 ounces (120 mL) of fruit juice, regular soda, or low-fat milk.  Check your blood glucose level 15 minutes after taking the glucose.  Take 15-20 grams more of glucose if the  repeat blood glucose level is still 70 mg/dL or below.  Eat a meal or snack within 1 hour once blood glucose levels return to normal.  Be alert to polyuria and polydipsia, which are early signs of hyperglycemia. An early awareness of hyperglycemia  allows for prompt treatment. Treat hyperglycemia as directed by your health care provider.  Exercise regularly as directed by your health care provider. This includes:  Stretching and performing strength training exercises, such as yoga or weight lifting, at least 2 times per week.  Performing a total of at least 150 minutes of moderate-intensity exercise each week, such as brisk walking or water aerobics.  Exercising at least 3 days per week, making sure you allow no more than 2 consecutive days to pass without exercising.  Avoiding long periods of inactivity (90 minutes or more). When you have to spend an extended period of time sitting down, take frequent breaks to walk or stretch.  Adjust your insulin dosing and food intake as needed if you start a new exercise or sport.  Follow your sick-day plan at any time you are unable to eat or drink as usual.   Do not use any tobacco products including cigarettes, chewing tobacco, or electronic cigarettes. If you need help quitting, ask your health care provider.  Limit alcohol intake to no more than 1 drink per day for nonpregnant women and 2 drinks per day for men. You should drink alcohol only when you are also eating food. Talk with your health care provider about whether alcohol is safe for you. Tell your health care provider if you drink alcohol several times a week.  Keep all follow-up visits as directed by your health care provider.  Schedule an eye exam within 5 years of diagnosis and then annually.  Perform daily skin and foot care. Examine your skin and feet daily for cuts, bruises, redness, nail problems, bleeding, blisters, or sores. A foot exam should be done by a health care provider 5  years after diagnosis, and then every year after the first exam.  Brush your teeth and gums at least twice a day and floss at least once a day. Follow up with your dentist regularly.  Share your diabetes management plan with your workplace or school.  Keep your immunizations up to date. It is recommended that you receive a flu (influenza) vaccine every year. It is also recommended that you receive a pneumonia (pneumococcal) vaccine. If you are 47 years of age or older and have never received a pneumonia vaccine, this vaccine may be given as a series of two separate shots. Ask your health care provider which additional vaccines may be recommended.  Learn to manage stress.  Obtain ongoing diabetes education and support as needed.  Participate in or seek rehabilitation as needed to maintain or improve independence and quality of life. Request a physical or occupational therapy referral if you are having foot or hand numbness, or difficulties with grooming, dressing, eating, or physical activity. SEEK MEDICAL CARE IF:   You are unable to eat food or drink fluids for more than 6 hours.  You have nausea and vomiting for more than 6 hours.  Your blood glucose level is over 240 mg/dL.  There is a change in mental status.  You develop an additional serious illness.  You have diarrhea for more than 6 hours.  You have been sick or have had a fever for a couple of days and are not getting better.  You have pain during any physical activity. SEEK IMMEDIATE MEDICAL CARE IF:  You have difficulty breathing.  You have moderate to large ketone levels. MAKE SURE YOU:  Understand these instructions.  Will watch your condition.  Will get help right away if you are not doing well  or get worse.   This information is not intended to replace advice given to you by your health care provider. Make sure you discuss any questions you have with your health care provider.   Document Released:  02/18/2000 Document Revised: 11/11/2014 Document Reviewed: 09/19/2011 Elsevier Interactive Patient Education 2016 [employer]  Type 2 Diabetes Mellitus, Adult Type 2 diabetes mellitus is a long-term (chronic) disease. In type 2 diabetes:  The pancreas does not make enough of a hormone called insulin.  The cells in the body do not respond as well to the insulin that is made.  Both of the above can happen. Normally, insulin moves sugars from food into tissue cells. This gives you energy. If you have type 2 diabetes, sugars cannot be moved into tissue cells. This causes high blood sugar (hyperglycemia).  Your doctors will set personal treatment goals for you based on your age, your medicines, how long you have had diabetes, and any other medical conditions you have. Generally, the goal of treatment is to maintain the following blood glucose levels:  Before meals (preprandial): 80-130 mg/dL.  After meals (postprandial): below 180 mg/dL.  A1c: less than 6.5-7%. HOME CARE  Have your hemoglobin A1c level checked twice a year. The level shows if your diabetes is under control or out of control.  Test your blood sugar level every day as told by your doctor.  Check your ketone levels by testing your pee (urine) when you are sick and as told.  Take your diabetes or insulin medicine as told by your doctor.  Never run out of insulin.  Adjust how much insulin you give yourself based on how many carbs (carbohydrates) you eat. Carbs are in many foods, such as fruits, vegetables, whole grains, and dairy products.  Have a healthy snack between every healthy meal. Have 3 meals and 3 snacks a day.  Lose weight if you are overweight.  Carry a medical alert card or wear your medical alert jewelry.  Carry a 15-gram carb snack with you at all times. Examples include:  Glucose pills, 3 or 4.  Glucose gel, 15-gram tube.  Raisins, 2 tablespoons (24 grams).  Jelly beans, 6.  Animal crackers,  8.  Regular (not diet) pop, 4 ounces (120 milliliters).  Gummy treats, 9.  Notice low blood sugar (hypoglycemia) symptoms, such as:  Shaking (tremors).  Trouble thinking clearly.  Sweating.  Faster heart rate.  Headache.  Dry mouth.  Hunger.  Crabbiness (irritability).  Being worried or tense (anxious).  Restless sleep.  A change in speech or coordination.  Confusion.  Treat low blood sugar right away. If you are alert and can swallow, follow the 15:15 rule:  Take 15-20 grams of a rapid-acting glucose or carb. This includes glucose gel, glucose pills, or 4 ounces (120 milliliters) of fruit juice, regular pop, or low-fat milk.  Check your blood sugar level 15 minutes after taking the glucose.  Take 15-20 grams more of glucose if the repeat blood sugar level is still 70 mg/dL (milligrams/deciliter) or below.  Eat a meal or snack within 1 hour of the blood sugar levels going back to normal.  Notice early symptoms of high blood sugar, such as:  Being really thirsty or drinking a lot (polydipsia).  Peeing a lot (polyuria).  Do at least 150 minutes of physical activity a week or as told.  Split the 150 minutes of activity up during the week. Do not do 150 minutes of activity in one day.  Perform exercises,  such as weight lifting, at least 2 times a week or as told.  Spend no more than 90 minutes at one time inactive.  Adjust your insulin or food intake as needed if you start a new exercise or sport.  Follow your sick-day plan when you are not able to eat or drink as usual.  Do not smoke, chew tobacco, or use electronic cigarettes.  Women who are not pregnant should drink no more than 1 drink a day. Men should drink no more than 2 drinks a day.  Only drink alcohol with food.  Ask your doctor if alcohol is safe for you.  Tell your doctor if you drink alcohol several times during the week.  See your doctor regularly.  Schedule an eye exam soon after you  are told you have diabetes. Schedule exams once every year.  Check your skin and feet every day. Check for cuts, bruises, redness, nail problems, bleeding, blisters, or sores. A doctor should do a foot exam once a year.  Brush your teeth and gums twice a day. Floss once a day. Visit your dentist regularly.  Share your diabetes plan with your workplace or school.  Keep your shots that fight diseases (vaccines) up to date.  Get a flu (influenza) shot every year.  Get a pneumonia shot. If you are 36 years of age or older and you have never gotten a pneumonia shot, you might need to get two shots.  Ask your doctor which other shots you should get.  Learn how to deal with stress.  Get diabetes education and support as needed.  Ask your doctor for special help if:  You need help to maintain or improve how you do things on your own.  You need help to maintain or improve the quality of your life.  You have foot or hand problems.  You have trouble cleaning yourself, dressing, eating, or doing physical activity. GET HELP IF:  You are unable to eat or drink for more than 6 hours.  You feel sick to your stomach (nauseous) or throw up (vomit) for more than 6 hours.  Your blood sugar level is over 240 mg/dL.  There is a change in mental status.  You get another serious illness.  You have watery poop (diarrhea) for more than 6 hours.  You have been sick or have had a fever for 2 or more days and are not getting better.  You have pain when you are active. GET HELP RIGHT AWAY IF:  You have trouble breathing.  Your ketone levels are higher than your doctor says they should be. MAKE SURE YOU:  Understand these instructions.  Will watch your condition.  Will get help right away if you are not doing well or get worse.   This information is not intended to replace advice given to you by your health care provider. Make sure you discuss any questions you have with your health care  provider.   Document Released: 11/30/2007 Document Revised: 07/07/2014 Document Reviewed: 09/22/2011 Else[employer] Interactive Patient Education [employer]

## 2015-02-01 NOTE — Progress Notes (Signed)
Pre visit review using our clinic review tool, if applicable. No additional management support is needed unless otherwise documented below in the visit note. 

## 2015-02-02 ENCOUNTER — Ambulatory Visit (INDEPENDENT_AMBULATORY_CARE_PROVIDER_SITE_OTHER): Payer: BLUE CROSS/BLUE SHIELD | Admitting: Endocrinology

## 2015-02-02 ENCOUNTER — Encounter: Payer: Self-pay | Admitting: Endocrinology

## 2015-02-02 ENCOUNTER — Ambulatory Visit: Payer: BLUE CROSS/BLUE SHIELD | Admitting: Family Medicine

## 2015-02-02 ENCOUNTER — Encounter: Payer: BLUE CROSS/BLUE SHIELD | Attending: Endocrinology | Admitting: Nutrition

## 2015-02-02 VITALS — BP 143/80 | HR 108 | Temp 99.0°F | Ht 69.5 in | Wt 203.0 lb

## 2015-02-02 DIAGNOSIS — E109 Type 1 diabetes mellitus without complications: Secondary | ICD-10-CM

## 2015-02-02 DIAGNOSIS — E1065 Type 1 diabetes mellitus with hyperglycemia: Secondary | ICD-10-CM | POA: Diagnosis not present

## 2015-02-02 LAB — C-PEPTIDE: C-Peptide: 0.9 ng/mL (ref 0.80–3.90)

## 2015-02-02 LAB — HIV ANTIBODY (ROUTINE TESTING W REFLEX): HIV: NONREACTIVE

## 2015-02-02 MED ORDER — GLUCOSE BLOOD VI STRP: 1.0000 | ORAL_STRIP | Freq: Four times a day (QID) | Status: DC

## 2015-02-02 MED ORDER — INSULIN ASPART 100 UNIT/ML FLEXPEN: 5.0000 [IU] | PEN_INJECTOR | Freq: Three times a day (TID) | SUBCUTANEOUS | Status: DC

## 2015-02-02 MED ORDER — INSULIN GLARGINE 100 UNIT/ML SOLOSTAR PEN: 10.0000 [IU] | PEN_INJECTOR | Freq: Every day | SUBCUTANEOUS | Status: DC

## 2015-02-02 NOTE — Patient Instructions (Addendum)
good diet and exercise significantly improve the control of your diabetes.  please let me know if you wish to be referred to a dietician.  high blood sugar is very risky to your health.  you should see an eye doctor and dentist every year.  It is very important to get all recommended vaccinations.  check your blood sugar 4 times a day.  vary the time of day when you check, between before the 3 meals, and at bedtime.  also check if you have symptoms of your blood sugar being too high or too low.  please keep a record of the readings and bring it to your next appointment here (or you can bring the meter itself).  You can write it on any piece of paper.  please call us sooner if your blood sugar goes below 70, or if you have a lot of readings over 200. Drink plenty of fluids.   i have sent a prescription to your pharmacy, for insulins and strips. Please call us in a few days, to tell us how the blood sugar is.   Please come back for a follow-up appointment in 1 week.

## 2015-02-02 NOTE — Telephone Encounter (Signed)
Called patient and let him know his labs confirm diabetes.  Pt scheduled appointment today ( 02/02/15 ) at 1:15pm.

## 2015-02-02 NOTE — Telephone Encounter (Signed)
After speaking with Dr. Claiborne BillingsKuneff, it was decided to try to get pt into see Endo immediately.  Spoke with Dr. George HughEllison's nurse and got patient appointment today at 2:15.  Patient aware.
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
## 2015-02-03 LAB — GLUTAMIC ACID DECARBOXYLASE AUTO ABS

## 2015-02-03 NOTE — Progress Notes (Signed)
Discussed with patient the need to take insulin and what types of insulin he will be on.  We reviewed how to given insulin using pens, and he re demonstrated the use of the Novolog pen and gave himself 5u into his abdominal area without any difficulty.   He was shown the lantus pen and discussed the timing of this insulin and the fact that he takes this once a day. He was given 2 One Touch Verio meters and shown how to use it.  He re demonstrated this correctly, and we reviewed the need to test ac and HS.  He will see me in one week to review blood sugars and call if questions before then. He was given a sheet to record the readings.    We reviewed low blood sugars--symptons and treatments and he reported good understanding of this. He had no final questions.

## 2015-02-03 NOTE — Patient Instructions (Signed)
Take 10u of lantus every evening Take 5u of novolog before all meals Test blood sugars before meals and at bedtime Charge meter when you get home

## 2015-02-08 LAB — ANTI-ISLET CELL ANTIBODY: Pancreatic Islet Cell Antibody: <5 JDF Units (ref <5)

## 2015-02-09 ENCOUNTER — Encounter: Payer: BLUE CROSS/BLUE SHIELD | Attending: Endocrinology | Admitting: Nutrition

## 2015-02-09 ENCOUNTER — Encounter: Payer: Self-pay | Admitting: Endocrinology

## 2015-02-09 ENCOUNTER — Ambulatory Visit (INDEPENDENT_AMBULATORY_CARE_PROVIDER_SITE_OTHER): Payer: BLUE CROSS/BLUE SHIELD | Admitting: Endocrinology

## 2015-02-09 VITALS — BP 130/84 | HR 75 | Temp 98.6°F | Ht 69.5 in | Wt 212.0 lb

## 2015-02-09 DIAGNOSIS — E109 Type 1 diabetes mellitus without complications: Secondary | ICD-10-CM | POA: Diagnosis not present

## 2015-02-09 DIAGNOSIS — Z713 Dietary counseling and surveillance: Secondary | ICD-10-CM | POA: Insufficient documentation

## 2015-02-09 MED ORDER — INSULIN ASPART 100 UNIT/ML FLEXPEN: PEN_INJECTOR | SUBCUTANEOUS | Status: DC

## 2015-02-09 NOTE — Progress Notes (Signed)
Subjective:    Patient ID: Henry Walter, male    DOB: 1990-10-05, 24 y.o.   MRN: [id number]  HPI Pt returns for f/u of diabetes mellitus: DM type: 1 Dx'ed: 2016 Complications: none Therapy: insulin since dx DKA: only at dx Severe hypoglycemia: never Pancreatitis: never Other: he takes multiple daily injections Interval history: no cbg record, but states cbg's vary from 139-400's.  It is lower in the afternoon (or with activity), and higher at all other times of day.   Past Medical History  Diagnosis Date  . Fracture, metacarpal, neck 12/31/2012    right index  . H/O fracture of nose 12/21/2012  . Complication of anesthesia     Reports being "mean" when he woke up    Past Surgical History  Procedure Laterality Date  . Open reduction internal fixation (orif) metacarpal Right 01/23/2013    Procedure: RIGHT INDEX FINGER OPEN REDUCTION INTERNAL FIXATION (ORIF) METACARPAL ;  Surgeon: Sharma Covert, MD;  Location: [facility];  Service: Orthopedics;  Laterality: Right;  . Open reduction internal fixation (orif) distal radial fracture Left 11/23/2014    Procedure: OPEN REDUCTION INTERNAL FIXATION (ORIF) DISTAL RADIAL FRACTURE WITH CARPAL TUNNEL RELEASE;  Surgeon: Sheral Apley, MD;  Location: [facility];  Service: Orthopedics;  Laterality: Left;  . Hand surgery Left 11/23/2014    Social History   Social History  . Marital Status: Single    Spouse Name: N/A  . Number of Children: N/A  . Years of Education: N/A   Occupational History  . Not on file.   Social History Main Topics  . Smoking status: Current Every Day Smoker -- 0.50 packs/day for 6 years    Types: Cigarettes  . Smokeless tobacco: Former Neurosurgeon    Types: Chew  . Alcohol Use: No     Comment: quit drinking  . Drug Use: Yes    Special: Marijuana  . Sexual Activity: Yes    Birth Control/ Protection: Condom   Other Topics Concern  . Not on file   Social History Narrative   HS grad. [employer].  Single.    Denies alcohol. Admits to Marijuana and tobacco.    caffeinated beverages.    Does not wear seatbelt.    Exercises 3x a week.    Smoke detector in the home.    Firearms in the home. Locked cabinet.    Safe in his relationships.     Current Outpatient Prescriptions on File Prior to Visit  Medication Sig Dispense Refill  . diazepam (VALIUM) 5 MG tablet Take 5 mg by mouth.    Marland Kitchen glucose blood (ONETOUCH VERIO) test strip 1 each by Other route 4 (four) times daily. And lancets 4/day 120 each 12  . Insulin Glargine (LANTUS SOLOSTAR) 100 UNIT/ML Solostar Pen Inject 10 Units into the skin at bedtime. 5 pen PRN  . oxyCODONE-acetaminophen (PERCOCET) 5-325 MG per tablet Take 1-2 tablets by mouth every 4 (four) hours as needed for severe pain. 90 tablet 0   No current facility-administered medications on file prior to visit.    Allergies  Allergen Reactions  . Hydrocodone Nausea And Vomiting    Family History  Problem Relation Age of Onset  . Cancer Mother     adrenal gland cancer  . Heart attack Father   . Hypertension Father   . Stroke Maternal Grandfather   . Cancer Paternal Grandfather     lung cancer  . Diabetes Neg Hx     BP  130/84 mmHg  Pulse 75  Temp(Src) 98.6 F (37 C) (Oral)  Ht 5' 9.5" (1.765 m)  Wt 212 lb (96.163 kg)  BMI 30.87 kg/m2  SpO2 97%   Review of Systems He denies hypoglycemia    Objective:   Physical Exam VITAL SIGNS:  See vs page GENERAL: no distress SKIN:  Insulin injection sites at the anterior abdomen are normal.         Assessment & Plan:  DM: he needs increased rx   Patient is advised the following: Patient Instructions  check your blood sugar 4 times a day.  vary the time of day when you check, between before the 3 meals, and at bedtime.  also check if you have symptoms of your blood sugar being too high or too low.  please keep a record of the readings and bring it to your next appointment here (or you can bring the meter  itself).  You can write it on any piece of paper.  please call us sooner if your blood sugar goes below 70, or if you have a lot of readings over 200. Please increase the novolog 3 times a day (just before each meal) 7-5-7 units.   Please continue the same lantus.   Please call us in a few days, to tell us how the blood sugar is.   Please come back for a follow-up appointment in 2 weeks.

## 2015-02-09 NOTE — Patient Instructions (Addendum)
check your blood sugar 4 times a day.  vary the time of day when you check, between before the 3 meals, and at bedtime.  also check if you have symptoms of your blood sugar being too high or too low.  please keep a record of the readings and bring it to your next appointment here (or you can bring the meter itself).  You can write it on any piece of paper.  please call us sooner if your blood sugar goes below 70, or if you have a lot of readings over 200. Please increase the novolog 3 times a day (just before each meal) 7-5-7 units.   Please continue the same lantus.   Please call us in a few days, to tell us how the blood sugar is.   Please come back for a follow-up appointment in 2 weeks.

## 2015-02-09 NOTE — Progress Notes (Signed)
Patient is very interested in Dexcom CGM.  Brochure given ancd paperwork filled out and faxed He will call me when he getsit in.  Also wanted information on insulin pump.  We discussed advantages and disadvantages of pump therapy.  He was given brochures on the 4 pump and told to look them over and call if questions.   Pt. Reports no difficulty giving insulin and testing blood sugars.

## 2015-02-09 NOTE — Patient Instructions (Signed)
Read brochures on pump therapy and call if questions.

## 2015-02-10 ENCOUNTER — Other Ambulatory Visit: Payer: Self-pay

## 2015-02-10 DIAGNOSIS — E119 Type 2 diabetes mellitus without complications: Secondary | ICD-10-CM | POA: Insufficient documentation

## 2015-02-10 MED ORDER — ONETOUCH VERIO IQ SYSTEM W/DEVICE KIT: PACK | Status: DC

## 2015-03-09 ENCOUNTER — Ambulatory Visit: Payer: BLUE CROSS/BLUE SHIELD | Admitting: Endocrinology

## 2015-03-16 ENCOUNTER — Telehealth: Payer: Self-pay | Admitting: Endocrinology

## 2015-03-16 NOTE — Telephone Encounter (Signed)
[facility] ask if our office has a record of patient B/S, log tried to contact patient no response  [phone]

## 2015-03-17 NOTE — Telephone Encounter (Signed)
Attempted to reach [facility]. Will try again at a later time.

## 2015-03-19 ENCOUNTER — Telehealth: Payer: Self-pay | Admitting: Endocrinology

## 2015-03-19 NOTE — Telephone Encounter (Signed)
Attempted to reach [facility] at [facility] unable to reach anyone at [facility].

## 2015-03-19 NOTE — Telephone Encounter (Signed)
Attempted to reach Tim at [facility] unable to reach anyone at [facility].  

## 2015-03-19 NOTE — Telephone Encounter (Signed)
[facility] from [facility] would like to know what patient b/s are, haven't been able to get in touch with patient to get them from him, please advise

## 2015-03-22 NOTE — Telephone Encounter (Signed)
Attempted to reach [facility]. Will try at a later time.

## 2015-03-23 NOTE — Telephone Encounter (Signed)
Attempted to reach [facility]. I could not get in contact with a representative. Will try again at a later time.

## 2015-03-24 NOTE — Telephone Encounter (Signed)
[facility] advised we do not have a log book but we had office notes that stated the range of the blood sugar readings. [facility] declined this information.

## 2015-10-01 ENCOUNTER — Telehealth: Payer: Self-pay | Admitting: Family Medicine

## 2015-10-01 ENCOUNTER — Telehealth: Payer: Self-pay | Admitting: Endocrinology

## 2015-10-01 MED ORDER — INSULIN ASPART 100 UNIT/ML FLEXPEN: PEN_INJECTOR | SUBCUTANEOUS | 2 refills | Status: DC

## 2015-10-01 NOTE — Telephone Encounter (Signed)
Patient need refill of insulin aspart (NOVOLOG FLEXPEN) 100 UNIT/ML FlexPen, Insulin Glargine (LANTUS SOLOSTAR) 100 UNIT/ML Solostar Pen  [facility] [address] [place], [place] - [address] [phone] (Phone) [phone] (Fax)

## 2015-10-01 NOTE — Telephone Encounter (Signed)
Patient needs refill of Novolog & Lantis sent to [facility]. Patient has contacted Dr. George Hugh office & they advised that the refill would need to come from PCP.

## 2015-10-01 NOTE — Telephone Encounter (Signed)
Rx sent electronically. I am not sure who usuall schedules appointments. Can you route it to the correct person?

## 2015-10-01 NOTE — Telephone Encounter (Signed)
Spoke with patient instructed him to schedule an appt with Dr Everardo All for refills on his insulin per Dr Claiborne Billings. Dr Everardo All is managing this and is the ordering physician. Patient verbalized understanding.

## 2015-10-01 NOTE — Telephone Encounter (Signed)
Please refill x 3 months Ov is due 

## 2015-10-06 ENCOUNTER — Telehealth: Payer: Self-pay | Admitting: Endocrinology

## 2015-10-06 MED ORDER — INSULIN GLARGINE 100 UNIT/ML SOLOSTAR PEN: 10.0000 [IU] | PEN_INJECTOR | Freq: Every day | SUBCUTANEOUS | 99 refills | Status: DC

## 2015-10-06 NOTE — Telephone Encounter (Signed)
Do we have copay cards here for the novolog and lantus?

## 2015-10-06 NOTE — Telephone Encounter (Signed)
[facility] Call: Caller is at [facility] and his insulin is not there: Only need Lantus refilled.  Is there a coupon or discount please advise.

## 2015-10-06 NOTE — Telephone Encounter (Signed)
Sent in Lantus to pharmacy, called to notify patient no voicemail to leave message. Looking for discount card for patient.

## 2015-10-07 NOTE — Telephone Encounter (Signed)
Attempted to reach the pt and advise we do have copayment cards for Lantus and Novolog. Pt vm was full. Will attempt at a later time.

## 2015-10-07 NOTE — Telephone Encounter (Signed)
Pt advised we do have co-payment cards for him to come and pick up. Pt will come by today or tomorrow to pick the cards up.

## 2015-10-12 ENCOUNTER — Encounter (HOSPITAL_BASED_OUTPATIENT_CLINIC_OR_DEPARTMENT_OTHER): Payer: Self-pay | Admitting: Emergency Medicine

## 2015-10-12 ENCOUNTER — Emergency Department (HOSPITAL_BASED_OUTPATIENT_CLINIC_OR_DEPARTMENT_OTHER)
Admission: EM | Admit: 2015-10-12 | Discharge: 2015-10-12 | Disposition: A | Discharge status: Home / Self Care | Payer: BLUE CROSS/BLUE SHIELD | Attending: Emergency Medicine | Admitting: Emergency Medicine

## 2015-10-12 ENCOUNTER — Emergency Department (HOSPITAL_BASED_OUTPATIENT_CLINIC_OR_DEPARTMENT_OTHER): Payer: BLUE CROSS/BLUE SHIELD

## 2015-10-12 DIAGNOSIS — F17210 Nicotine dependence, cigarettes, uncomplicated: Secondary | ICD-10-CM | POA: Insufficient documentation

## 2015-10-12 DIAGNOSIS — S9030XA Contusion of unspecified foot, initial encounter: Secondary | ICD-10-CM

## 2015-10-12 DIAGNOSIS — S8002XA Contusion of left knee, initial encounter: Secondary | ICD-10-CM | POA: Insufficient documentation

## 2015-10-12 DIAGNOSIS — W132XXA Fall from, out of or through roof, initial encounter: Secondary | ICD-10-CM | POA: Insufficient documentation

## 2015-10-12 DIAGNOSIS — S60512A Abrasion of left hand, initial encounter: Secondary | ICD-10-CM | POA: Insufficient documentation

## 2015-10-12 DIAGNOSIS — Y929 Unspecified place or not applicable: Secondary | ICD-10-CM | POA: Insufficient documentation

## 2015-10-12 DIAGNOSIS — S8001XA Contusion of right knee, initial encounter: Secondary | ICD-10-CM | POA: Insufficient documentation

## 2015-10-12 DIAGNOSIS — E109 Type 1 diabetes mellitus without complications: Secondary | ICD-10-CM | POA: Insufficient documentation

## 2015-10-12 DIAGNOSIS — Z794 Long term (current) use of insulin: Secondary | ICD-10-CM | POA: Insufficient documentation

## 2015-10-12 DIAGNOSIS — S9032XA Contusion of left foot, initial encounter: Secondary | ICD-10-CM | POA: Insufficient documentation

## 2015-10-12 DIAGNOSIS — Y999 Unspecified external cause status: Secondary | ICD-10-CM | POA: Insufficient documentation

## 2015-10-12 DIAGNOSIS — Y939 Activity, unspecified: Secondary | ICD-10-CM | POA: Insufficient documentation

## 2015-10-12 HISTORY — DX: Type 1 diabetes mellitus without complications: E10.9

## 2015-10-12 MED ORDER — ACETAMINOPHEN 500 MG PO TABS
1000.0000 mg | ORAL_TABLET | Freq: Once | ORAL | Status: AC
  Administered 2015-10-12: 1000 mg via ORAL
  Filled 2015-10-12: qty 2
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
## 2015-10-12 NOTE — Discharge Instructions (Signed)
Take Tylenol for pain as directed. Use the crutches for comfort. Call Dr.Hudnall to schedule appointment in the office if having significant pain or difficulty walking in one week.
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
## 2015-10-12 NOTE — ED Provider Notes (Signed)
[facility] Provider Note   CSN: [id number] Arrival date & time: 10/12/15  1500  First Provider Contact:  First MD Initiated Contact with Patient 10/12/15 1545        History   Chief Complaint Chief Complaint  Patient presents with  . Foot Pain    HPI Henry Walter is a 25 y.o. male.Plains of left foot pain after he fell off of a roof from a height of 8 feet 1 week ago. Pain is worse with weightbearing improved with nonweightbearing he's been treating himself with ibuprofen with partial relief. Other injuries include slight pain in both knees when his "knees banged together" and an abrasion at his left hand, palmar aspect. He has no knee pain presently. He complains of left foot pain. Abrasion is healing well.  HPI  Past Medical History:  Diagnosis Date  . Complication of anesthesia    Reports being "mean" when he woke up  . Diabetes type 1, controlled ([place])   . Fracture, metacarpal, neck 12/31/2012   right index  . H/O fracture of nose 12/21/2012    Patient Active Problem List   Diagnosis Date Noted  . Diabetes ([place]) 02/10/2015  . Urinary frequency 02/01/2015  . Loss of weight 02/01/2015  . Dry mouth 02/01/2015  . Encounter to establish care 02/01/2015  . Blurry vision, bilateral 02/01/2015  . Distal radius fracture, left 11/24/2014    Past Surgical History:  Procedure Laterality Date  . HAND SURGERY Left 11/23/2014  . OPEN REDUCTION INTERNAL FIXATION (ORIF) DISTAL RADIAL FRACTURE Left 11/23/2014   Procedure: OPEN REDUCTION INTERNAL FIXATION (ORIF) DISTAL RADIAL FRACTURE WITH CARPAL TUNNEL RELEASE;  Surgeon: Renette Butters, MD;  Location: [facility];  Service: Orthopedics;  Laterality: Left;  . OPEN REDUCTION INTERNAL FIXATION (ORIF) METACARPAL Right 01/23/2013   Procedure: RIGHT INDEX FINGER OPEN REDUCTION INTERNAL FIXATION (ORIF) METACARPAL ;  Surgeon: Linna Hoff, MD;  Location: [facility];  Service: Orthopedics;  Laterality: Right;        Home Medications    Prior to Admission medications   Medication Sig Start Date End Date Taking? Authorizing Provider  Blood Glucose Monitoring Suppl (ONETOUCH VERIO IQ SYSTEM) W/DEVICE KIT Use to check blood sugar 4 times per day 02/10/15   Renato Shin, MD  diazepam (VALIUM) 5 MG tablet Take 5 mg by mouth. 12/07/14   Historical Provider, MD  glucose blood (ONETOUCH VERIO) test strip 1 each by Other route 4 (four) times daily. And lancets 4/day 02/02/15   Renato Shin, MD  insulin aspart (NOVOLOG FLEXPEN) 100 UNIT/ML FlexPen 3 times a day (just before each meal) 7-5-7 units, and pen needles 4/day 10/01/15   Renato Shin, MD  Insulin Glargine (LANTUS SOLOSTAR) 100 UNIT/ML Solostar Pen Inject 10 Units into the skin at bedtime. 10/06/15   Renato Shin, MD  oxyCODONE-acetaminophen (PERCOCET) 5-325 MG per tablet Take 1-2 tablets by mouth every 4 (four) hours as needed for severe pain. 11/24/14   Lovett Calender, PA-C    Family History Family History  Problem Relation Age of Onset  . Cancer Mother     adrenal gland cancer  . Heart attack Father   . Hypertension Father   . Stroke Maternal Grandfather   . Cancer Paternal Grandfather     lung cancer  . Diabetes Neg Hx     Social History Social History  Substance Use Topics  . Smoking status: Current Every Day Smoker    Packs/day: 0.50    Years: 6.00  Types: Cigarettes  . Smokeless tobacco: Former Systems developer    Types: Chew  . Alcohol use No     Comment: quit drinking     Allergies   Hydrocodone   Review of Systems Review of Systems  Constitutional: Negative.   HENT: Negative.   Respiratory: Negative.   Cardiovascular: Negative.        Syncope  Gastrointestinal: Negative.   Musculoskeletal: Positive for arthralgias.       Left foot pain  Skin: Negative.   Allergic/Immunologic: Positive for immunocompromised state.       Diabetic. Last tetanus shot less than 5 years ago  Neurological: Negative.   Psychiatric/Behavioral:  Negative.   All other systems reviewed and are negative.    Physical Exam Updated Vital Signs BP 152/90   Pulse 84   Temp 98.2 F (36.8 C) (Oral)   Resp 16   Ht 5' 11"  (1.803 m)   Wt 180 lb (81.6 kg)   SpO2 98%   BMI 25.10 kg/m   Physical Exam  Constitutional: He appears well-developed and well-nourished.  HENT:  Head: Normocephalic and atraumatic.  Eyes: Conjunctivae are normal. Pupils are equal, round, and reactive to light.  Neck: Neck supple. No tracheal deviation present. No thyromegaly present.  Cardiovascular: Normal rate and regular rhythm.   No murmur heard. Pulmonary/Chest: Effort normal and breath sounds normal.  Abdominal: Soft. Bowel sounds are normal. He exhibits no distension. There is no tenderness.  Musculoskeletal: Normal range of motion. He exhibits no edema or tenderness.  Left lower extremity minimally tender arch of foot. Minimal soft tissue swelling. DP pulse 2+ good capillary refill. Left upper extremity 2 mm abrasion at the hyperthenar eminence. No tenderness or swelling. Extremities are otherwise without contusion abrasion or tenderness neurovascularly intact  Neurological: He is alert. Coordination normal.  Skin: Skin is warm and dry. No rash noted.  Psychiatric: He has a normal mood and affect.  Nursing note and vitals reviewed.    ED Treatments / Results  Labs (all labs ordered are listed, but only abnormal results are displayed) Labs Reviewed - No data to display  EKG  EKG Interpretation None       Radiology Dg Foot Complete Left  Result Date: 10/12/2015 CLINICAL DATA:  25 year old male with a history of foot pain EXAM: LEFT FOOT - COMPLETE 3+ VIEW COMPARISON:  None. FINDINGS: There is no evidence of fracture or dislocation. There is no evidence of arthropathy or other focal bone abnormality. Soft tissues are unremarkable. IMPRESSION: Negative. Signed, Dulcy Fanny. Earleen Newport, DO [facility]  Radiology Electronically Signed   By: Corrie Mckusick D.O.   On: 10/12/2015 16:04    Procedures Procedures (including critical care time)  Medications Ordered in ED Medications  acetaminophen (TYLENOL) tablet 1,000 mg (not administered)     Initial Impression / Assessment and Plan / ED Course  I have reviewed the triage vital signs and the nursing notes.  Pertinent labs & imaging results that were available during my care of the patient were reviewed by me and considered in my medical decision making (see chart for details).  Clinical Course  Plan crutches, Tylenol. Referral Dr.Hudnall if not improving in a week Results for orders placed or performed in visit on 02/01/15  TSH  Result Value Ref Range   TSH 1.53 0.35 - 4.50 uIU/mL  T4, free  Result Value Ref Range   Free T4 1.01 0.60 - 1.60 ng/dL  HgB A1c  Result Value Ref  Range   Hgb A1c MFr Bld 13.5 (H) 4.6 - 6.5 %  CBC w/Diff  Result Value Ref Range   WBC 5.2 4.0 - 10.5 K/uL   RBC 5.67 4.22 - 5.81 Mil/uL   Hemoglobin 17.0 13.0 - 17.0 g/dL   HCT 50.0 39.0 - 52.0 %   MCV 88.2 78.0 - 100.0 fl   MCHC 34.1 30.0 - 36.0 g/dL   RDW 13.1 11.5 - 15.5 %   Platelets 320.0 150.0 - 400.0 K/uL   Neutrophils Relative % 50.5 43.0 - 77.0 %   Lymphocytes Relative 41.1 12.0 - 46.0 %   Monocytes Relative 7.1 3.0 - 12.0 %   Eosinophils Relative 0.8 0.0 - 5.0 %   Basophils Relative 0.5 0.0 - 3.0 %   Neutro Abs 2.6 1.4 - 7.7 K/uL   Lymphs Abs 2.1 0.7 - 4.0 K/uL   Monocytes Absolute 0.4 0.1 - 1.0 K/uL   Eosinophils Absolute 0.0 0.0 - 0.7 K/uL   Basophils Absolute 0.0 0.0 - 0.1 K/uL  Comp Met (CMET)  Result Value Ref Range   Sodium 134 (L) 135 - 145 mEq/L   Potassium 4.0 3.5 - 5.1 mEq/L   Chloride 94 (L) 96 - 112 mEq/L   CO2 25 19 - 32 mEq/L   Glucose, Bld 470 (H) 70 - 99 mg/dL   BUN 8 6 - 23 mg/dL   Creatinine, Ser 0.73 0.40 - 1.50 mg/dL   Total Bilirubin 0.7 0.2 - 1.2 mg/dL   Alkaline Phosphatase 127 (H) 39 - 117 U/L   AST 13 0 -  37 U/L   ALT 20 0 - 53 U/L   Total Protein 7.6 6.0 - 8.3 g/dL   Albumin 5.1 3.5 - 5.2 g/dL   Calcium 10.3 8.4 - 10.5 mg/dL   GFR 138.61 >60.00 mL/min  HIV antibody (with reflex)  Result Value Ref Range   HIV 1&2 Ab, 4th Generation NONREACTIVE NONREACTIVE  C-peptide  Result Value Ref Range   C-Peptide 0.90 0.80 - 3.90 ng/mL  Anti-islet cell antibody  Result Value Ref Range   Pancreatic Islet Cell Antibody <5 <5 JDF Units  Glutamic acid decarboxylase auto abs  Result Value Ref Range   Glutamic Acid Decarb Ab >250 (H) <5 IU/mL  POCT urinalysis dipstick  Result Value Ref Range   Color, UA yellow    Clarity, UA clear    Glucose, UA 500    Bilirubin, UA negative    Ketones, UA 40    Spec Grav, UA 1.015    Blood, UA negative    pH, UA 5.5    Protein, UA negative    Urobilinogen, UA 0.2    Nitrite, UA negative    Leukocytes, UA Negative Negative   Dg Foot Complete Left  Result Date: 10/12/2015 CLINICAL DATA:  25 year old male with a history of foot pain EXAM: LEFT FOOT - COMPLETE 3+ VIEW COMPARISON:  None. FINDINGS: There is no evidence of fracture or dislocation. There is no evidence of arthropathy or other focal bone abnormality. Soft tissues are unremarkable. IMPRESSION: Negative. Signed, Dulcy Fanny. Earleen Newport, DO [facility] Radiology Electronically Signed   By: Corrie Mckusick D.O.   On: 10/12/2015 16:04     Final Clinical Impressions(s) / ED Diagnoses  Diagnosis #1 fall #2 contusions multiple sites Final diagnoses:  None    New Prescriptions New Prescriptions   No medications on file     Orlie Dakin, MD 10/12/15 1644

## 2015-10-12 NOTE — ED Notes (Signed)
MD at bedside. 

## 2015-10-26 ENCOUNTER — Telehealth: Payer: Self-pay | Admitting: Endocrinology

## 2015-10-26 NOTE — Telephone Encounter (Signed)
See note and please advise on how to proceed. Thanks!

## 2015-10-26 NOTE — Telephone Encounter (Signed)
I contacted the pt and advised of Md's instructions. Pt verbalized understanding and [facility]

## 2015-10-26 NOTE — Telephone Encounter (Signed)
iPatient has no insurance and has no monet to buy his insulin, [facility] it was 1000.00. So is it possible someone can please  help him.  nsulin aspart (NOVOLOG FLEXPEN) 100 UNIT/ML FlexPen  Insulin Glargine (LANTUS SOLOSTAR) 100 UNIT/ML Solostar Pen 5 pen   Please advisse

## 2015-10-26 NOTE — Telephone Encounter (Signed)
Buy reg and NPH.  Each is $25 per bottle.

## 2015-10-28 NOTE — Telephone Encounter (Signed)
I contacted the pt. He requested clarification on the Novolin R and N medcaition [facility]. Pt was advised the Novolin R is the short acting and the Novolin N is the long acting insulin. Per Dr. Essie HartEllison Novolin R is to replace novolog and Novolin N is to replace Lantus. Pt voiced understanding on this and had no further questions at this time.

## 2015-10-28 NOTE — Telephone Encounter (Signed)
Patient need help identifying medication caller stated insurance ran out was Novolin R and N  Need to know which equivalent to Novalog. Please advise

## 2016-03-01 ENCOUNTER — Telehealth: Payer: Self-pay | Admitting: Endocrinology

## 2016-03-01 MED ORDER — INSULIN ASPART 100 UNIT/ML FLEXPEN: PEN_INJECTOR | SUBCUTANEOUS | 0 refills | Status: DC

## 2016-03-01 NOTE — Telephone Encounter (Signed)
Patient need refill of lantus, Novalog and test strips for the one touch meter.    Send to  [facility] [address], [place] - [address] [phone] (Phone) [phone] (Fax)

## 2016-03-01 NOTE — Telephone Encounter (Signed)
Refill submitted for a 30 day supply. Patient notified an appointment is needed for further refills.

## 2016-03-08 NOTE — Telephone Encounter (Signed)
See message and advise on how to proceed.  Thanks!  

## 2016-03-08 NOTE — Telephone Encounter (Signed)
Patient ask if he could get some samples of insulin, he do not have any money, not enough for the [facility] brand insulin. Please help

## 2016-03-08 NOTE — Telephone Encounter (Signed)
Best option is [facility].

## 2016-03-09 NOTE — Telephone Encounter (Signed)
I contacted the patient at 450 pm on 03/09/2015 and advised of this message. Patient voiced understanding.

## 2016-03-22 ENCOUNTER — Ambulatory Visit: Payer: BLUE CROSS/BLUE SHIELD | Admitting: Endocrinology

## 2016-04-13 ENCOUNTER — Ambulatory Visit (INDEPENDENT_AMBULATORY_CARE_PROVIDER_SITE_OTHER): Payer: BLUE CROSS/BLUE SHIELD | Admitting: Endocrinology

## 2016-04-13 ENCOUNTER — Encounter: Payer: Self-pay | Admitting: Endocrinology

## 2016-04-13 VITALS — BP 130/90 | HR 82 | Wt 169.6 lb

## 2016-04-13 DIAGNOSIS — E109 Type 1 diabetes mellitus without complications: Secondary | ICD-10-CM | POA: Diagnosis not present

## 2016-04-13 LAB — POCT GLYCOSYLATED HEMOGLOBIN (HGB A1C): Hemoglobin A1C: 13.2

## 2016-04-13 MED ORDER — GLUCOSE BLOOD VI STRP: 1.0000 | ORAL_STRIP | Freq: Four times a day (QID) | 12 refills | Status: DC

## 2016-04-13 MED ORDER — BASAGLAR KWIKPEN 100 UNIT/ML ~~LOC~~ SOPN: 10.0000 [IU] | PEN_INJECTOR | Freq: Every day | SUBCUTANEOUS | 11 refills | Status: DC

## 2016-04-13 MED ORDER — INSULIN LISPRO 100 UNIT/ML (KWIKPEN): PEN_INJECTOR | SUBCUTANEOUS | 11 refills | Status: DC

## 2016-04-13 NOTE — Patient Instructions (Addendum)
check your blood sugar 4 times a day.  vary the time of day when you check, between before the 3 meals, and at bedtime.  also check if you have symptoms of your blood sugar being too high or too low.  please keep a record of the readings and bring it to your next appointment here (or you can bring the meter itself).  You can write it on any piece of paper.  please call us sooner if your blood sugar goes below 70, or if you have a lot of readings over 200. I have sent a prescription to your pharmacy, to resume both insulins (similar to what you were on, but different brand).   Please come back for a follow-up appointment in 2 weeks.

## 2016-04-13 NOTE — Progress Notes (Signed)
Pre visit review using our clinic review tool, if applicable. No additional management support is needed unless otherwise documented below in the visit note. 

## 2016-04-13 NOTE — Progress Notes (Signed)
Subjective:    Patient ID: Henry Walter, male    DOB: 07/06/1990, 26 y.o.   MRN: [id number]  HPI Pt returns for f/u of diabetes mellitus: DM type: 1 Dx'ed: 2016 Complications: none Therapy: insulin since dx DKA: only once, at dx Severe hypoglycemia: never Pancreatitis: never Other: he takes multiple daily injections Interval history: He has regained his insurance.  He has not recently taken insulin.  pt states he feels well in general, except for fatigue.   Past Medical History:  Diagnosis Date  . Complication of anesthesia    Reports being "mean" when he woke up  . Diabetes type 1, controlled ([facility])   . Fracture, metacarpal, neck 12/31/2012   right index  . H/O fracture of nose 12/21/2012    Past Surgical History:  Procedure Laterality Date  . HAND SURGERY Left 11/23/2014  . OPEN REDUCTION INTERNAL FIXATION (ORIF) DISTAL RADIAL FRACTURE Left 11/23/2014   Procedure: OPEN REDUCTION INTERNAL FIXATION (ORIF) DISTAL RADIAL FRACTURE WITH CARPAL TUNNEL RELEASE;  Surgeon: Sheral Apleyimothy D Murphy, MD;  [facility];  Service: Orthopedics;  Laterality: Left;  . OPEN REDUCTION INTERNAL FIXATION (ORIF) METACARPAL Right 01/23/2013   Procedure: RIGHT INDEX FINGER OPEN REDUCTION INTERNAL FIXATION (ORIF) METACARPAL ;  Surgeon: Sharma CovertFred W Ortmann, MD;  Location: [facility];  Service: Orthopedics;  Laterality: Right;    Social History   Social History  . Marital status: Single    Spouse name: N/A  . Number of children: N/A  . Years of education: N/A   Occupational History  . Not on file.   Social History Main Topics  . Smoking status: Current Every Day Smoker    Packs/day: 0.50    Years: 6.00    Types: Cigarettes  . Smokeless tobacco: Former NeurosurgeonUser    Types: Chew  . Alcohol use No     Comment: quit drinking  . Drug use: Yes    Types: Marijuana  . Sexual activity: Yes    Birth control/ protection: Condom   Other Topics Concern  . Not on file   Social History  Narrative   HS grad. [employer]. Single.    Denies alcohol. Admits to Marijuana and tobacco.    caffeinated beverages.    Does not wear seatbelt.    Exercises 3x a week.    Smoke detector in the home.    Firearms in the home. Locked cabinet.    Safe in his relationships.     No current outpatient prescriptions on file prior to visit.   No current facility-administered medications on file prior to visit.     Allergies  Allergen Reactions  . Hydrocodone Nausea And Vomiting    Family History  Problem Relation Age of Onset  . Cancer Mother     adrenal gland cancer  . Heart attack Father   . Hypertension Father   . Stroke Maternal Grandfather   . Cancer Paternal Grandfather     lung cancer  . Diabetes Neg Hx     BP 130/90 (BP Location: Right Arm, Patient Position: Sitting, Cuff Size: Normal)   Pulse 82   Wt 169 lb 9.6 oz (76.9 kg)   SpO2 97%   BMI 23.65 kg/m    Review of Systems He has lost 45 lbs.  No n/v    Objective:   Physical Exam VITAL SIGNS:  See vs page GENERAL: no distress Pulses: dorsalis pedis intact bilat.   MSK: no deformity of the feet CV: no leg edema  Skin:  no ulcer on the feet.  normal color and temp on the feet. Neuro: sensation is intact to touch on the feet  A1c=13.2%    Assessment & Plan:  Type 1 DM: much worse off rx.  Noncompliance with cbg recording and insulin.  This complicates the rx of DM.  Weight loss: prob due to severe hyperglycemia.  We follow this, as pt is back on insulin.   Patient is advised the following: Patient Instructions  check your blood sugar 4 times a day.  vary the time of day when you check, between before the 3 meals, and at bedtime.  also check if you have symptoms of your blood sugar being too high or too low.  please keep a record of the readings and bring it to your next appointment here (or you can bring the meter itself).  You can write it on any piece of paper.  please call us sooner if your blood sugar goes  below 70, or if you have a lot of readings over 200. I have sent a prescription to your pharmacy, to resume both insulins (similar to what you were on, but different brand).   Please come back for a follow-up appointment in 2 weeks.

## 2016-04-28 ENCOUNTER — Encounter: Payer: Self-pay | Admitting: Endocrinology

## 2016-04-28 ENCOUNTER — Ambulatory Visit (INDEPENDENT_AMBULATORY_CARE_PROVIDER_SITE_OTHER): Payer: Managed Care, Other (non HMO) | Admitting: Endocrinology

## 2016-04-28 VITALS — BP 130/84 | HR 90 | Ht 69.5 in | Wt 175.0 lb

## 2016-04-28 DIAGNOSIS — E109 Type 1 diabetes mellitus without complications: Secondary | ICD-10-CM | POA: Diagnosis not present

## 2016-04-28 MED ORDER — INSULIN DETEMIR 100 UNIT/ML FLEXPEN: 12.0000 [IU] | PEN_INJECTOR | Freq: Every day | SUBCUTANEOUS | 11 refills | Status: DC

## 2016-04-28 NOTE — Progress Notes (Signed)
Subjective:    Patient ID: Henry Walter, male    DOB: 04/23/1990, 26 y.o.   MRN: [id number]  HPI Pt returns for f/u of diabetes mellitus:  DM type: 1 Dx'ed: 2016 Complications: none Therapy: insulin since dx DKA: only once, at dx Severe hypoglycemia: never.  Pancreatitis: never.   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   Interval history: He has resumed insulin. pt states he feels well in general, except for fatigue.  Ins will no longer cover lantus.  Meter is downloaded today, and the printout is scanned into the record.  It varies from 129-500.  There is no trend throughout the day.  It is lower when he misses a meal, even if he also skips the humalog.   Past Medical History:  Diagnosis Date  . Complication of anesthesia    Reports being "mean" when he woke up  . Diabetes type 1, controlled ([facility])   . Fracture, metacarpal, neck 12/31/2012   right index  . H/O fracture of nose 12/21/2012    Past Surgical History:  Procedure Laterality Date  . HAND SURGERY Left 11/23/2014  . OPEN REDUCTION INTERNAL FIXATION (ORIF) DISTAL RADIAL FRACTURE Left 11/23/2014   Procedure: OPEN REDUCTION INTERNAL FIXATION (ORIF) DISTAL RADIAL FRACTURE WITH CARPAL TUNNEL RELEASE;  Surgeon: Sheral Apleyimothy D Murphy, MD;  Location: [facility];  Service: Orthopedics;  Laterality: Left;  . OPEN REDUCTION INTERNAL FIXATION (ORIF) METACARPAL Right 01/23/2013   Procedure: RIGHT INDEX FINGER OPEN REDUCTION INTERNAL FIXATION (ORIF) METACARPAL ;  Surgeon: Sharma CovertFred W Ortmann, MD;  Location: [facility];  Service: Orthopedics;  Laterality: Right;    Social History   Social History  . Marital status: Single    Spouse name: N/A  . Number of children: N/A  . Years of education: N/A   Occupational History  . Not on file.   Social History Main Topics  . Smoking status: Current Every Day Smoker    Packs/day: 0.50    Years: 6.00    Types: Cigarettes  .  Smokeless tobacco: Former NeurosurgeonUser    Types: Chew  . Alcohol use No     Comment: quit drinking  . Drug use: Yes    Types: Marijuana  . Sexual activity: Yes    Birth control/ protection: Condom   Other Topics Concern  . Not on file   Social History Narrative   HS grad. [employer]. Single.    Denies alcohol. Admits to Marijuana and tobacco.    caffeinated beverages.    Does not wear seatbelt.    Exercises 3x a week.    Smoke detector in the home.    Firearms in the home. Locked cabinet.    Safe in his relationships.     Current Outpatient Prescriptions on File Prior to Visit  Medication Sig Dispense Refill  . glucose blood (ONETOUCH VERIO) test strip 1 each by Other route 4 (four) times daily. And lancets 4/day 120 each 12  . insulin lispro (HUMALOG KWIKPEN) 100 UNIT/ML KiwkPen 3 times a day (just before each meal) 7-5-7 units, and pen needles 4.day. 15 mL 11   No current facility-administered medications on file prior to visit.     Allergies  Allergen Reactions  . Hydrocodone Nausea And Vomiting    Family History  Problem Relation Age of Onset  . Cancer Mother     adrenal gland cancer  . Heart attack Father   . Hypertension Father   .  Stroke Maternal Grandfather   . Cancer Paternal Grandfather     lung cancer  . Diabetes Neg Hx     BP 130/84   Pulse 90   Ht 5' 9.5" (1.765 m)   Wt 175 lb (79.4 kg)   SpO2 97%   BMI 25.47 kg/m    Review of Systems He denies hypoglycemia.      Objective:   Physical Exam VITAL SIGNS:  See vs page GENERAL: no distress Pulses: dorsalis pedis intact bilat.   MSK: no deformity of the feet.   CV: no leg edema.  Skin:  no ulcer on the feet.  normal color and temp on the feet. Neuro: sensation is intact to touch on the feet.       Assessment & Plan:  Type 1 DM: much better back on rx.  I sent Bonita Quin message, asking if ins wold approve  V-GO.  I am uncertain if pt can operate reg pump.   Patient is advised the following: Patient  Instructions  check your blood sugar 4 times a day.  vary the time of day when you check, between before the 3 meals, and at bedtime.  also check if you have symptoms of your blood sugar being too high or too low.  please keep a record of the readings and bring it to your next appointment here (or you can bring the meter itself).  You can write it on any piece of paper.  please call us sooner if your blood sugar goes below 70, or if you have a lot of readings over 200. I have sent a prescription to your pharmacy, to change basaglar to levemir, 12 units at bedtime. Please continue the same humalog for now.  Please come back for a follow-up appointment in 1 month.

## 2016-04-28 NOTE — Patient Instructions (Addendum)
check your blood sugar 4 times a day.  vary the time of day when you check, between before the 3 meals, and at bedtime.  also check if you have symptoms of your blood sugar being too high or too low.  please keep a record of the readings and bring it to your next appointment here (or you can bring the meter itself).  You can write it on any piece of paper.  please call us sooner if your blood sugar goes below 70, or if you have a lot of readings over 200. I have sent a prescription to your pharmacy, to change basaglar to levemir, 12 units at bedtime. Please continue the same humalog for now.  Please come back for a follow-up appointment in 1 month.

## 2016-05-04 ENCOUNTER — Telehealth: Payer: Self-pay | Admitting: Endocrinology

## 2016-05-04 ENCOUNTER — Encounter: Payer: Self-pay | Admitting: Endocrinology

## 2016-05-04 MED ORDER — INSULIN LISPRO 100 UNIT/ML (KWIKPEN): PEN_INJECTOR | SUBCUTANEOUS | 11 refills | Status: DC

## 2016-05-04 NOTE — Telephone Encounter (Signed)
Patient need pen needles for the insulin lispro (HUMALOG KWIKPEN) 100 UNIT/ML KiwkPen   [facility] to   [facility] [address]- [place], [place] [id number] [phone] (Phone) [phone] (Fax)

## 2016-05-04 NOTE — Telephone Encounter (Signed)
Refill submitted. 

## 2016-05-19 ENCOUNTER — Encounter: Payer: Self-pay | Admitting: Family Medicine

## 2016-05-19 ENCOUNTER — Ambulatory Visit (INDEPENDENT_AMBULATORY_CARE_PROVIDER_SITE_OTHER): Payer: Managed Care, Other (non HMO) | Admitting: Family Medicine

## 2016-05-19 VITALS — BP 124/76 | HR 86 | Temp 97.7°F | Resp 20 | Wt 182.0 lb

## 2016-05-19 DIAGNOSIS — Z114 Encounter for screening for human immunodeficiency virus [HIV]: Secondary | ICD-10-CM | POA: Diagnosis not present

## 2016-05-19 DIAGNOSIS — Z202 Contact with and (suspected) exposure to infections with a predominantly sexual mode of transmission: Secondary | ICD-10-CM

## 2016-05-19 MED ORDER — CEFTRIAXONE SODIUM 250 MG IJ SOLR
250.0000 mg | Freq: Once | INTRAMUSCULAR | Status: AC
  Administered 2016-05-19: 250 mg via INTRAMUSCULAR

## 2016-05-19 MED ORDER — AZITHROMYCIN 500 MG PO TABS: 1000.0000 mg | ORAL_TABLET | Freq: Once | ORAL | 0 refills | Status: AC

## 2016-05-19 NOTE — Progress Notes (Signed)
Henry Walter , 1990/04/08, 26 y.o., male MRN: [id number] Patient Care Team    Relationship Specialty Notifications Start End  Natalia Leatherwood, DO PCP - General Family Medicine  05/01/16     CC: exposure to STD Subjective: Pt presents for an OV with complaints of exposure to STD. His Girlfriend went to GYN and tested positive for chlamydia.  They are now expecting there first child. He denies dysuria, penile drainage or lesions.   No flowsheet data found.  Allergies  Allergen Reactions  . Hydrocodone Nausea And Vomiting   Social History  Substance Use Topics  . Smoking status: Current Every Day Smoker    Packs/day: 0.50    Years: 6.00    Types: Cigarettes  . Smokeless tobacco: Former Neurosurgeon    Types: Chew  . Alcohol use No     Comment: quit drinking   Past Medical History:  Diagnosis Date  . Complication of anesthesia    Reports being "mean" when he woke up  . Diabetes type 1, controlled (HCC)   . Fracture, metacarpal, neck 12/31/2012   right index  . H/O fracture of nose 12/21/2012   Past Surgical History:  Procedure Laterality Date  . HAND SURGERY Left 11/23/2014  . OPEN REDUCTION INTERNAL FIXATION (ORIF) DISTAL RADIAL FRACTURE Left 11/23/2014   Procedure: OPEN REDUCTION INTERNAL FIXATION (ORIF) DISTAL RADIAL FRACTURE WITH CARPAL TUNNEL RELEASE;  Surgeon: Sheral Apley, MD;  Location: [facility] OR;  Service: Orthopedics;  Laterality: Left;  . OPEN REDUCTION INTERNAL FIXATION (ORIF) METACARPAL Right 01/23/2013   Procedure: RIGHT INDEX FINGER OPEN REDUCTION INTERNAL FIXATION (ORIF) METACARPAL ;  Surgeon: Sharma Covert, MD;  Location: [facility];  Service: Orthopedics;  Laterality: Right;   Family History  Problem Relation Age of Onset  . Cancer Mother     adrenal gland cancer  . Heart attack Father   . Hypertension Father   . Stroke Maternal Grandfather   . Cancer Paternal Grandfather     lung cancer  . Diabetes Neg Hx    Allergies as of 05/19/2016        Reactions   Hydrocodone Nausea And Vomiting      Medication List       Accurate as of 05/19/16  9:02 AM. Always use your most recent med list.          glucose blood test strip Commonly known as:  ONETOUCH VERIO 1 each by Other route 4 (four) times daily. And lancets 4/day   Insulin Detemir 100 UNIT/ML Pen Commonly known as:  LEVEMIR FLEXTOUCH Inject 12 Units into the skin at bedtime.   insulin lispro 100 UNIT/ML KiwkPen Commonly known as:  HUMALOG KWIKPEN 3 times a day (just before each meal) 7-5-7 units, and pen needles 4.day.       No results found for this or any previous visit (from the past 24 hour(s)). No results found.   ROS: Negative, with the exception of above mentioned in HPI   Objective:  BP 124/76 (BP Location: Right Arm, Patient Position: Sitting, Cuff Size: Large)   Pulse 86   Temp 97.7 F (36.5 C)   Resp 20   Wt 182 lb (82.6 kg)   SpO2 99%   BMI 26.49 kg/m  Body mass index is 26.49 kg/m. Gen: Afebrile. No acute distress. Nontoxic in appearance, well developed, well nourished.  HENT: AT. [place]. MMM Eyes:Pupils Equal Round Reactive to light, Extraocular movements intact,  Conjunctiva without redness, discharge or  icterus. Abd: Soft. NTND. BS present Skin: no rashes, purpura or petechiae.  Neuro: Normal gait. PERLA. EOMi. Alert. Oriented x3   Assessment/Plan: Henry Walter is a [age] y.o. male present for OV for  Exposure to STD Encounter for screening for HIV - HIV antibody - GC/Chlamydia Probe Amp - RPR - azithromycin (ZITHROMAX) 500 MG tablet; Take 2 tablets (1,000 mg total) by mouth once.  Dispense: 2 tablet; Refill: 0 - IM rocpehin 250 mg - if + results will encourage test of cure by urine in 3 weeks.  - pt advised to avoid sexual contact for at least 1 week after both partners treated.   Reviewed expectations re: course of current medical issues.  Discussed self-management of symptoms.  Outlined signs and symptoms indicating  need for more acute intervention.  Patient verbalized understanding and all questions were answered.  Patient received an After-Visit Summary.   electronically signed by:  Felix Pacinienee Joice Nazario, DO  [facility] - OR

## 2016-05-19 NOTE — Patient Instructions (Signed)
Chlamydia, Male Chlamydia is an STD (sexually transmitted disease). It is a bacterial infection that spreads through sexual contact (is contagious). Chlamydia can occur in different areas of the body, including the tube that moves urine from the bladder out of the body (urethra), the throat, or the rectum. This condition is not difficult to treat. However, if left untreated, chlamydia can lead to more serious health problems. What are the causes? Chlamydia is caused by the bacteria Chlamydia trachomatis. It is passed from an infected partner during sexual activity. Chlamydia can spread through contact with the genitals, mouth, or rectum. What are the signs or symptoms? In some cases, there may not be any symptoms for this condition (asymptomatic), especially early in the infection. If symptoms develop, they may include:  Burning when urinating.  Urinating frequently.  Pain or swelling in the testicles.  Watery, mucus-like discharge from the penis.  Redness, soreness, and swelling (inflammation) of the rectum.  Bleeding or discharge from the rectum.  Abdominal pain.  Itching, burning, or redness in the eyes, or discharge from the eyes. How is this diagnosed? This condition may be diagnosed based on:  Urine tests.  Swab tests. Depending on your symptoms, your health care provider may use a cotton swab to collect discharge from your urethra or rectum to test for the bacteria. How is this treated? This condition is treated with antibiotic medicines. Follow these instructions at home: Medicines   Take over-the-counter and prescription medicines only as told by your health care provider.  Take your antibiotic medicine as told by your health care provider. Do not stop taking the antibiotic even if you start to feel better. Sexual activity   Tell sexual partners about your infection. This includes any oral, anal, or vaginal sex partners you have had within 60 days of when your symptoms  started. Sexual partners should also be treated, even if they have no signs of the disease.  Do not have sex until you and your sexual partners have completed treatment and your health care provider says it is okay. If your health care provider prescribed you a single dose treatment, wait 7 days after taking the treatment before having sex. General instructions   It is your responsibility to get your test results. Ask your health care provider, or the department performing the test, when your results will be ready.  Get plenty of rest.  Eat a healthy, well-balanced diet.  Drink enough fluids to keep your urine clear or pale yellow.  Keep all follow-up visits as told by your health care provider. This is important. You may need to be tested for infection again 3 months after treatment. How is this prevented? The only sure way to prevent chlamydia is to avoid sexual intercourse. However, you can lower your risk by:  Using latex condoms correctly every time you have sexual intercourse.  Not having multiple sexual partners.  Asking if your sexual partner has been tested for STIs and had negative results. Contact a health care provider if:  You develop new symptoms or your symptoms do not get better after completing treatment.  You have a fever or chills.  You have pain during sexual intercourse.  You develop new joint pain or swelling near your joints.  You have pain or soreness in your testicles. Get help right away if:  Your pain gets worse and does not get better with medicine.  You have abnormal discharge.  You develop flu-like symptoms, such as night sweats, sore throat, or muscle  aches. Summary  Chlamydia is an STD (sexually transmitted disease). It is a bacterial infection that spreads (is contagious) through sexual contact.  This condition is not difficult to treat, however, if left untreated, it can lead to more serious health problems.  In some cases, there may not  be any symptoms for this condition (asymptomatic).  This condition is treated with antibiotic medicines.  Using latex condoms correctly every time you have sexual intercourse can help prevent chlamydia. This information is not intended to replace advice given to you by your health care provider. Make sure you discuss any questions you have with your health care provider. Document Released: 02/20/2005 Document Revised: 02/07/2016 Document Reviewed: 02/07/2016 Elsevier Interactive Patient Education  2017 [employer]    Refrain from sexual contact until both partners have been treated and at least 1 week has passed.  We treated you today for both Gonorrhea and Chlamydia presumptively.

## 2016-05-20 LAB — GC/CHLAMYDIA PROBE AMP
CT PROBE, AMP APTIMA: DETECTED — AB
GC Probe RNA: NOT DETECTED

## 2016-05-20 LAB — RPR

## 2016-05-21 LAB — HIV ANTIBODY (ROUTINE TESTING W REFLEX): HIV: NONREACTIVE

## 2016-05-22 ENCOUNTER — Telehealth: Payer: Self-pay | Admitting: Family Medicine

## 2016-05-22 DIAGNOSIS — Z202 Contact with and (suspected) exposure to infections with a predominantly sexual mode of transmission: Secondary | ICD-10-CM
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
## 2016-05-22 NOTE — Telephone Encounter (Signed)
Please call pt: - his Chlamydia was positive, he is expecting this to be positive.  - He was fully treated with the medication provided.  - I would encourage a urine cytology (first morning urine if possible, he can pick up a container and return if desired), in 2 weeks after treatment. Lab appt only.

## 2016-05-23 ENCOUNTER — Telehealth: Payer: Self-pay | Admitting: Nutrition

## 2016-05-23 NOTE — Telephone Encounter (Signed)
Insurance faxed to [employer] to see if his insurance will cover the V-go

## 2016-05-26 ENCOUNTER — Ambulatory Visit: Payer: Managed Care, Other (non HMO) | Admitting: Endocrinology

## 2016-06-08 ENCOUNTER — Ambulatory Visit: Payer: Managed Care, Other (non HMO) | Admitting: Family Medicine

## 2016-08-04 ENCOUNTER — Other Ambulatory Visit: Payer: Self-pay

## 2016-08-04 ENCOUNTER — Telehealth: Payer: Self-pay | Admitting: Endocrinology

## 2016-08-04 MED ORDER — INSULIN LISPRO 100 UNIT/ML (KWIKPEN): PEN_INJECTOR | SUBCUTANEOUS | 11 refills | Status: DC

## 2016-08-04 NOTE — Telephone Encounter (Signed)
**  Remind patient they can make refill requests via MyChart**  Medication refill request (Name & Dosage):  nsulin lispro (HUMALOG KWIKPEN) 100 UNIT/ML KiwkPen  Preferred pharmacy (Name & Address):  [facility] [address]- [place], [place] - [address] [phone] (Phone) [phone] (Fax)     Other comments (if applicable):    Please call patient once the rx has been placed

## 2016-08-04 NOTE — Telephone Encounter (Signed)
[facility]

## 2016-09-12 ENCOUNTER — Telehealth: Payer: Self-pay | Admitting: Endocrinology

## 2016-09-12 NOTE — Telephone Encounter (Signed)
You should discard this pen

## 2016-09-12 NOTE — Telephone Encounter (Signed)
[facility] has question about insulin pen, he left it out in the sun and is wanting to know if he can put it back in the refrigerator and use it after it has cooled. Please advise

## 2016-09-12 NOTE — Telephone Encounter (Signed)
See message and please advise, Thanks!  

## 2016-09-12 NOTE — Telephone Encounter (Signed)
Patient notified and voiced understanding.

## 2016-10-30 ENCOUNTER — Telehealth: Payer: Self-pay | Admitting: Endocrinology

## 2016-10-30 NOTE — Telephone Encounter (Signed)
Patient needs to know what insulin would he be able to get without insurance. Patient does not have insurance yet. Call to advise.

## 2016-10-31 NOTE — Telephone Encounter (Signed)
Called patient and [facility] bought the Novolin N & R. I also notified  him of dosages for new insulin.

## 2016-10-31 NOTE — Telephone Encounter (Signed)
You can buy regular and NPH at [facility]: You can change humalog unit-for-unit to regular. Changing levemir to NPH, take just 8 units instead of the 12 of levemir.

## 2016-10-31 NOTE — Telephone Encounter (Signed)
Routing to you °

## 2016-12-20 ENCOUNTER — Other Ambulatory Visit: Payer: Self-pay

## 2016-12-20 ENCOUNTER — Telehealth: Payer: Self-pay | Admitting: Endocrinology

## 2016-12-20 MED ORDER — INSULIN LISPRO 100 UNIT/ML (KWIKPEN): PEN_INJECTOR | SUBCUTANEOUS | 11 refills | Status: DC

## 2016-12-20 MED ORDER — INSULIN PEN NEEDLE 31G X 8 MM MISC: 10 refills | Status: DC

## 2016-12-20 MED ORDER — GLUCOSE BLOOD VI STRP: 1.0000 | ORAL_STRIP | Freq: Four times a day (QID) | 12 refills | Status: DC

## 2016-12-20 MED ORDER — INSULIN DETEMIR 100 UNIT/ML FLEXPEN: 12.0000 [IU] | PEN_INJECTOR | Freq: Every day | SUBCUTANEOUS | 11 refills | Status: AC

## 2016-12-20 NOTE — Telephone Encounter (Signed)
Done

## 2016-12-20 NOTE — Telephone Encounter (Signed)
Patient need a refill of Humalog insulin pen with needles tops and test strips for the one touch ultra. [facility] [place]

## 2017-01-01 ENCOUNTER — Telehealth: Payer: Self-pay | Admitting: *Deleted

## 2017-01-01 ENCOUNTER — Other Ambulatory Visit: Payer: Self-pay

## 2017-01-01 MED ORDER — GLUCOSE BLOOD VI STRP: ORAL_STRIP | 12 refills | Status: AC

## 2017-01-01 MED ORDER — GLUCOSE BLOOD VI STRP: 1.0000 | ORAL_STRIP | Freq: Four times a day (QID) | 12 refills | Status: AC

## 2017-01-01 MED ORDER — "INSULIN SYRINGE 28G X 1/2"" 0.5 ML MISC": 11 refills | Status: AC

## 2017-01-01 MED ORDER — INSULIN LISPRO 100 UNIT/ML ~~LOC~~ SOLN: SUBCUTANEOUS | 11 refills | Status: AC

## 2017-01-01 NOTE — Telephone Encounter (Signed)
Patient states he called his insurance [facility] and they stated that his medication will be covered at 100 % if it is in the Tier 1. His Humalog Insulin Pen is in Tier 2. Patient needs a RX for Insuline Syringes they are in Tier 1 and will be covered at 100 %. Patient states if you have any questions . Can contact him at  [phone]. Please advise. Thank you

## 2017-01-01 NOTE — Telephone Encounter (Signed)
Called patient & I have sent in prescription for vials plus syringes so they will be covered 100% by insurance.

## 2020-07-15 ENCOUNTER — Emergency Department (HOSPITAL_BASED_OUTPATIENT_CLINIC_OR_DEPARTMENT_OTHER): Payer: No Typology Code available for payment source

## 2020-07-15 ENCOUNTER — Emergency Department (HOSPITAL_BASED_OUTPATIENT_CLINIC_OR_DEPARTMENT_OTHER)
Admission: EM | Admit: 2020-07-15 | Discharge: 2020-07-15 | Disposition: A | Discharge status: Home / Self Care | Payer: No Typology Code available for payment source | Attending: Emergency Medicine | Admitting: Emergency Medicine

## 2020-07-15 ENCOUNTER — Other Ambulatory Visit: Payer: Self-pay

## 2020-07-15 ENCOUNTER — Encounter (HOSPITAL_BASED_OUTPATIENT_CLINIC_OR_DEPARTMENT_OTHER): Payer: Self-pay | Admitting: *Deleted

## 2020-07-15 DIAGNOSIS — Z794 Long term (current) use of insulin: Secondary | ICD-10-CM | POA: Insufficient documentation

## 2020-07-15 DIAGNOSIS — E109 Type 1 diabetes mellitus without complications: Secondary | ICD-10-CM | POA: Diagnosis not present

## 2020-07-15 DIAGNOSIS — S0033XA Contusion of nose, initial encounter: Secondary | ICD-10-CM

## 2020-07-15 DIAGNOSIS — S0992XA Unspecified injury of nose, initial encounter: Secondary | ICD-10-CM

## 2020-07-15 DIAGNOSIS — S0990XA Unspecified injury of head, initial encounter: Secondary | ICD-10-CM | POA: Diagnosis present

## 2020-07-15 DIAGNOSIS — W228XXA Striking against or struck by other objects, initial encounter: Secondary | ICD-10-CM | POA: Insufficient documentation

## 2020-07-15 DIAGNOSIS — Z23 Encounter for immunization: Secondary | ICD-10-CM | POA: Insufficient documentation

## 2020-07-15 DIAGNOSIS — F17210 Nicotine dependence, cigarettes, uncomplicated: Secondary | ICD-10-CM | POA: Diagnosis not present

## 2020-07-15 DIAGNOSIS — T148XXA Other injury of unspecified body region, initial encounter: Secondary | ICD-10-CM

## 2020-07-15 DIAGNOSIS — S022XXA Fracture of nasal bones, initial encounter for closed fracture: Secondary | ICD-10-CM | POA: Diagnosis not present

## 2020-07-15 DIAGNOSIS — S060X0A Concussion without loss of consciousness, initial encounter: Secondary | ICD-10-CM | POA: Insufficient documentation

## 2020-07-15 DIAGNOSIS — R519 Headache, unspecified: Secondary | ICD-10-CM

## 2020-07-15 LAB — CBG MONITORING, ED: Glucose-Capillary: 286 mg/dL — ABNORMAL HIGH (ref 70–99)

## 2020-07-15 MED ORDER — TETANUS-DIPHTH-ACELL PERTUSSIS 5-2.5-18.5 LF-MCG/0.5 IM SUSY
0.5000 mL | PREFILLED_SYRINGE | Freq: Once | INTRAMUSCULAR | Status: AC
  Administered 2020-07-15: 0.5 mL via INTRAMUSCULAR
  Filled 2020-07-15: qty 0.5
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
## 2020-07-15 NOTE — ED Notes (Signed)
RT Note: Glucose checked via [facility] Glucometer/downloaded into Epic, Pt. has implanted device in left upper extremity showing 2 point lower reading from own reading, RN made aware.
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
